# Patient Record
Sex: Female | Born: 1970 | Race: White | Hispanic: No | Marital: Married | Smoking: Former smoker
Health system: Southern US, Community
[De-identification: ages and names within clinical notes are randomized; demographics above are authoritative.]

## PROBLEM LIST (undated history)

## (undated) DIAGNOSIS — C50919 Malignant neoplasm of unspecified site of unspecified female breast: Secondary | ICD-10-CM

## (undated) DIAGNOSIS — F32A Depression, unspecified: Secondary | ICD-10-CM

## (undated) DIAGNOSIS — J189 Pneumonia, unspecified organism: Secondary | ICD-10-CM

## (undated) DIAGNOSIS — K219 Gastro-esophageal reflux disease without esophagitis: Secondary | ICD-10-CM

## (undated) DIAGNOSIS — F419 Anxiety disorder, unspecified: Secondary | ICD-10-CM

## (undated) HISTORY — PX: OTHER SURGICAL HISTORY: SHX169

## (undated) HISTORY — PX: MASTECTOMY: SHX3

## (undated) HISTORY — DX: Malignant neoplasm of unspecified site of unspecified female breast: C50.919

---

## 2001-06-19 ENCOUNTER — Encounter: Payer: Self-pay | Admitting: Oral Surgery

## 2001-06-19 ENCOUNTER — Inpatient Hospital Stay (HOSPITAL_COMMUNITY): Admission: RE | Admit: 2001-06-19 | Discharge: 2001-06-20 | Payer: Self-pay | Admitting: Oral Surgery

## 2008-04-01 ENCOUNTER — Ambulatory Visit (HOSPITAL_BASED_OUTPATIENT_CLINIC_OR_DEPARTMENT_OTHER): Admission: RE | Admit: 2008-04-01 | Discharge: 2008-04-01 | Payer: Self-pay | Admitting: Orthopedic Surgery

## 2008-04-04 ENCOUNTER — Encounter (HOSPITAL_COMMUNITY): Admission: RE | Admit: 2008-04-04 | Discharge: 2008-05-04 | Payer: Self-pay | Admitting: Orthopedic Surgery

## 2008-05-07 ENCOUNTER — Encounter (HOSPITAL_COMMUNITY): Admission: RE | Admit: 2008-05-07 | Discharge: 2008-06-06 | Payer: Self-pay | Admitting: Orthopedic Surgery

## 2008-06-09 ENCOUNTER — Encounter (HOSPITAL_COMMUNITY): Admission: RE | Admit: 2008-06-09 | Discharge: 2008-07-02 | Payer: Self-pay | Admitting: Orthopedic Surgery

## 2010-11-16 NOTE — Op Note (Signed)
NAME:  Amy Douglas, Amy Douglas NO.:  [id number]   MEDICAL RECORD NO.:  [id number]          PATIENT TYPE:  AMB   LOCATION:  DSC                          FACILITY:  [facility]   PHYSICIAN:  Eulas Post, MD    DATE OF BIRTH:  [birth year]   DATE OF PROCEDURE:  04/01/2008  DATE OF DISCHARGE:                               OPERATIVE REPORT   PREOPERATIVE DIAGNOSES:  Right shoulder impingement, AC joint arthritis,  and adhesive capsulitis.   POSTOPERATIVE DIAGNOSIS:  Right shoulder impingement, AC joint  arthritis, and adhesive capsulitis.   OPERATIVE PROCEDURE:  Right shoulder arthroscopy with acromioplasty,  distal clavicle excision, and manipulation under anesthesia.   PREOPERATIVE INDICATIONS:  Ms. Zuniga is a 40 year old woman who  complained about 2 years of right shoulder pain.  She had pain, mostly  with overhead motion, but it was fairly excruciating and limiting her  daily activities.  She elected to undergo the above-named procedures.  The risks, benefits and alternatives were discussed with her  preoperatively including, but not limited to risks of infection,  bleeding, nerve injury, rotator cuff tear, recurrent shoulder pain,  recurrent stiffness, cardiopulmonary complications, among others and she  is willing to proceed.   OPERATIVE FINDINGS:  The biceps tendon and labrum were completely  normal.  The glenohumeral surface was normal.  The rotator cuff was  completely normal from the articular side.  There was excellent  insertion of the supraspinatus fibers noted at the region of the biceps  pulley.  The infraspinatus also had a normal insertion as did  subscapularis.   The subacromial space had a subacromial spur and AC joint arthritis  noted.  The bursal side of the rotator cuff was slightly thinned, but  there was no full-thickness tear and even with aggressive probing, I  could not find the tear.   Exam under anesthesia prior to manipulation had forward  flexion to 160  degrees.  After manipulation, she went up to 180 degrees.  I did feel a  palpable and audible release of adhesions with the manipulation,  although it was not as dramatic as many that I performed, it certainly  did have an element of frozen shoulder.   OPERATIVE PROCEDURE:  The patient was brought to the operating room,  placed in supine position.  General anesthesia was administered.  A 1 g  intravenous Ancef was given.  She was turned in the lateral decubitus  position and the right upper extremity prepped and draped in usual  sterile fashion with 10 pounds of traction.  Diagnostic arthroscopy was  carried out with the above-named findings.  Subacromial space was  visualized after intra-articular arthroscopy and the subacromial space  was debrided using a shaver.  The CA ligament was released using the  ArthroCare wand and acromioplasty was performed with a bur.  Distal  clavicle excision was then also carried out.  Confirmation of multiple views was performed and the arthroscopic  instruments were then removed.  Portals were closed with Monocryl and  Steri-Strips and sterile gauze was applied.  The patient was awakened  and returned  to the PACU in stable and satisfactory condition.  There  were no complications.  The patient tolerated the procedure well.      Eulas Post, MD  Electronically Signed     JPL/MEDQ  D:  04/01/2008  T:  04/01/2008  Job:  [id number]

## 2011-04-04 LAB — POCT HEMOGLOBIN-HEMACUE: Hemoglobin: 13.8

## 2015-03-31 DIAGNOSIS — C50911 Malignant neoplasm of unspecified site of right female breast: Secondary | ICD-10-CM | POA: Insufficient documentation

## 2015-03-31 DIAGNOSIS — G894 Chronic pain syndrome: Secondary | ICD-10-CM | POA: Insufficient documentation

## 2015-03-31 DIAGNOSIS — Z9011 Acquired absence of right breast and nipple: Secondary | ICD-10-CM | POA: Insufficient documentation

## 2015-04-09 DIAGNOSIS — Z95828 Presence of other vascular implants and grafts: Secondary | ICD-10-CM | POA: Insufficient documentation

## 2016-09-19 ENCOUNTER — Ambulatory Visit (HOSPITAL_COMMUNITY): Payer: Self-pay

## 2016-09-21 ENCOUNTER — Encounter (HOSPITAL_COMMUNITY): Payer: BLUE CROSS/BLUE SHIELD | Attending: Hematology | Admitting: Hematology

## 2016-09-21 ENCOUNTER — Encounter (HOSPITAL_COMMUNITY): Payer: Self-pay | Admitting: Hematology

## 2016-09-21 VITALS — BP 138/76 | HR 100 | Temp 98.4°F | Resp 16 | Ht 68.0 in | Wt 212.5 lb

## 2016-09-21 DIAGNOSIS — Z9011 Acquired absence of right breast and nipple: Secondary | ICD-10-CM | POA: Diagnosis not present

## 2016-09-21 DIAGNOSIS — Z171 Estrogen receptor negative status [ER-]: Secondary | ICD-10-CM | POA: Diagnosis not present

## 2016-09-21 DIAGNOSIS — C50811 Malignant neoplasm of overlapping sites of right female breast: Secondary | ICD-10-CM

## 2016-09-21 DIAGNOSIS — Z9221 Personal history of antineoplastic chemotherapy: Secondary | ICD-10-CM | POA: Diagnosis not present

## 2016-09-21 DIAGNOSIS — Z853 Personal history of malignant neoplasm of breast: Secondary | ICD-10-CM | POA: Diagnosis not present

## 2016-09-21 NOTE — Progress Notes (Signed)
[facility]    HEMATOLOGY/ONCOLOGY CONSULTATION NOTE  Date of Service: 09/21/2016  Patient Care Team: Celedonio Savage, MD as PCP - General (Family Medicine)  CHIEF COMPLAINTS/PURPOSE OF CONSULTATION:  Transfer of care for continued management of triple negative breast cancer  HISTORY OF PRESENTING ILLNESS:   Amy Douglas is a 46 y.o. female who has been referred to [place] by Dr .Celedonio Savage, MD for evaluation and management of continued management of triple negative breast cancer.  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 Noted to have a high-grade triple negative tumor with negative lymph node biopsy.  Patient had a right breast mastectomy with sentinel lymph node biopsy on 03/10/2015 which showed a 4.3 cm high-grade tumor with negative margins negative sentinel lymph nodes grade 3. No DCIS noted no lymphovascular invasion. ER 1% positive, PR 0%, HER-2 negative. pT2pN0Mx (Stage IIA)  Patient was started on dose dense AC chemotherapy from 04/16/2015-05/27/2015. She apparently had significant issues with her [facility] chemotherapy and refused to continue adjuvant treatment with weekly Taxol. She also requests of her Port-A-Cath to be removed.  Patient reports that she has had genetic counseling and testing that apparently has not noted any obvious genetic mutations.  She has been treated by Dr. Audree Camel thus far and was last seen by him on 10/08/2015.  She has had a follow-up and a mammogram in August 2017 with her primary care physician which showed no evidence of recurrent breast cancer in the left breast.  She is here for further follow-up with [place]. She notes no acute new concerns. Has not noted any new regional lumps or bumps, no new skin changes over the breast, no palpable lumps in the left breast.  Energy levels have been good and she is working full-time. She notes hot flashes but  feels that these are mild and controlled. Denies any other focal symptoms Notes that she recently has had labs with her primary care physician.  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 Patient also notes continued his disease in the cervical spine.  #5 dyslipidemia  #6 vitamin D deficiency  #7 depression -on sertraline   SURGICAL HISTORY: #1 right breast mastectomy August 2016 with sentinel lymph node biopsy #2 cleft lip/cleft palate surgery .  SOCIAL HISTORY: Social History   Social History  . Marital status: Married    Spouse name: N/A  . Number of children: N/A  . Years of education: N/A   Occupational History  . Not on file.   Social History Main Topics  . Smoking status: Not on file  . Smokeless tobacco: Not on file  . Alcohol use Not on file  . Drug use: Unknown  . Sexual activity: Not on file   Other Topics Concern  . Not on file   Social History Narrative  . No narrative on file  Married and lives with her husband and son who is in his early 78s. Works as a Psychologist, educational at [employer] No alcohol use Denies any drug use  FAMILY HISTORY: Patient notes that she does not know much about her dad's side of the family  Mother had 51 siblings . One of them had stomach cancer in his 50s .does not know much about the others   ALLERGIES:  is allergic to hydroxyzine pamoate; amoxicillin-pot  clavulanate; latex; and other.  MEDICATIONS:  Current Outpatient Prescriptions  Medication Sig Dispense Refill  . omeprazole (PRILOSEC) 20 MG capsule Take 20 mg by mouth daily.    . propranolol (INDERAL) 10 MG tablet Take 10 mg by mouth daily.     . sertraline (ZOLOFT) 100 MG tablet Take 100 mg by mouth daily.     No current facility-administered medications for this visit.     REVIEW OF SYSTEMS:    10 Point review of Systems was  done is negative except as noted above.  PHYSICAL EXAMINATION: ECOG PERFORMANCE STATUS: 1 - Symptomatic but completely ambulatory  . Vitals:   09/21/16 1338  BP: 138/76  Pulse: 100  Resp: 16  Temp: 98.4 F (36.9 C)   Filed Weights   09/21/16 1338  Weight: 212 lb 8 oz (96.4 kg)   .Body mass index is 32.31 kg/m.  GENERAL:alert, in no acute distress and comfortable SKIN: no acute rashes, no significant lesions EYES: conjunctiva are pink and non-injected, sclera anicteric OROPHARYNX: MMM, no exudates, no oropharyngeal erythema or ulceration NECK: supple, no JVD LYMPH:  no palpable lymphadenopathy in the cervical, axillary or inguinal regions LUNGS: clear to auscultation b/l with normal respiratory effort BREAST: s/p rt mastectomy - surgical incision area healed with no concerning new skin changes, no left breast lumps palpable. No palpable regional lymphadenopathy noted HEART: regular rate & rhythm ABDOMEN:  normoactive bowel sounds , non tender, not distended. Extremity: no pedal edema PSYCH: alert & oriented x 3 with fluent speech NEURO: no focal motor/sensory deficits  LABORATORY DATA:  I have reviewed the data as listed  . CBC 04/01/2008  Hemoglobin 13.8 POINT OF CARE RESULT    .No flowsheet data found.   RADIOGRAPHIC STUDIES: I have personally reviewed the radiological images as listed and agreed with the findings in the report. No results found.  ASSESSMENT & PLAN:   46 yo caucasian female with   1) Stage II A triple negative breast cancer.  02/26/2015 Initial Diagnosis  Palbable mass -right breast - bx -invasive carcinoma , high grade,benign lymph node bx   03/10/2015 Surgery  Right breast mastectomy, sentinel lymph node biopsy for invasive ductal carcinoma, high-grade, of 4.3 cm in maximal dimension, negative margins, zero lymph nodes positive. Grade 3. No DCIS. No lymphovascular invasion. ER 1%, PR 0%, HER-2 1+.pT2, N0, MX  04/16/2015 - 05/27/2015  Chemotherapy  AC DD X4  PLan -patient had her last mammogram in August 2017 that showed no evidence of concerning lesions in her left breast. -Today her clinical examination does not suggest any evidence of breast cancer recurrence at this time. -She reports that she is recently had labs with her primary care physician which were within normal limits and chooses not to have repeat labs at this time. We shall try to obtain these labs from her primary care physician. -Her mammogram results from August 2017 were received from the primary care physician and reviewed and she'll be scanned into her chart for reference. -Her outside genetic testing results have been requested for review. As per patient absolutely no genetic mutations were noted though given a triple negative cancer and young age this was certainly concerning. -No other acute new focal symptoms at this time. -Maintain good physical activity level -Continue calcium and vitamin D -Is on Zoloft as per primary care physician for depression which also helped somewhat for hot flashes. -For hot flashes she was recommended to keep well-hydrated, maintain good physical activity, decrease caffeine intake. She  will also try vitamin E 400 units daily over-the-counter. -Continue follow-up with primary care physician for management of other medical comorbidities.  Return to care with M.D./PA in 3 months with repeat labs Next mammogram will be due in August 2018    All of the patients questions were answered with apparent satisfaction. The patient knows to call the clinic with any problems, questions or concerns.  I spent 45 minutes counseling the patient face to face. The total time spent in the appointment was 60 minutes and more than 50% was on counseling and direct patient cares.    Sullivan Lone MD [place] AAHIVMS [facility] Hematology/Oncology Physician [facility]  (Office):       [phone] (Work cell):  [phone] (Fax):            [phone]  09/21/2016 1:22 PM

## 2016-09-21 NOTE — Patient Instructions (Addendum)
[place] at [facility] Discharge Instructions  RECOMMENDATIONS MADE BY THE CONSULTANT AND ANY TEST RESULTS WILL BE SENT TO YOUR REFERRING PHYSICIAN.  You were seen today by Dr. Irene Limbo Follow up 3 months with labs See Amy up front for appointments   Thank you for choosing [place] at [facility] to provide your oncology and hematology care.  To afford each patient quality time with our provider, please arrive at least 15 minutes before your scheduled appointment time.    If you have a lab appointment with the [facility] please come in thru the  Main Entrance and check in at the main information desk  You need to re-schedule your appointment should you arrive 10 or more minutes late.  We strive to give you quality time with our providers, and arriving late affects you and other patients whose appointments are after yours.  Also, if you no show three or more times for appointments you may be dismissed from the clinic at the providers discretion.     Again, thank you for choosing [facility].  Our hope is that these requests will decrease the amount of time that you wait before being seen by our physicians.       _____________________________________________________________  Should you have questions after your visit to [facility], please contact our office at [phone] between the hours of 8:30 a.m. and 4:30 p.m.  Voicemails left after 4:30 p.m. will not be returned until the following business day.  For prescription refill requests, have your pharmacy contact our office.       Resources For Cancer Patients and their Caregivers ? American Cancer Society: Can assist with transportation, wigs, general needs, runs Look Good Feel Better.        587 376 0310 ? Cancer Care: Provides financial assistance, online support groups, medication/co-pay assistance.  1-800-813-HOPE (404)271-2406) ? [facility] Assists [place] cancer patients and their families through emotional , educational and financial support.  479-481-6912 ? [place] DSS Where to apply for food stamps, Medicaid and utility assistance. 204 351 0813 ? [facility]: Transportation to medical appointments. 979-251-0646 ? Social Security Administration: May apply for disability if have a Stage IV cancer. (870) 480-5109 438 104 5808 ? [employer], Disability and Transit Services: Assists with nutrition, care and transit needs. [place] Support Programs: @10RELATIVEDAYS @ > Cancer Support Group  2nd Tuesday of the month 1pm-2pm, Journey Room  > Creative Journey  3rd Tuesday of the month 1130am-1pm, Journey Room  > Look Good Feel Better  1st Wednesday of the month 10am-12 noon, Journey Room (Call [place] to register [phone])

## 2016-12-20 ENCOUNTER — Other Ambulatory Visit (HOSPITAL_COMMUNITY): Payer: Self-pay | Admitting: *Deleted

## 2016-12-20 DIAGNOSIS — C50011 Malignant neoplasm of nipple and areola, right female breast: Secondary | ICD-10-CM

## 2016-12-22 ENCOUNTER — Ambulatory Visit (HOSPITAL_COMMUNITY): Payer: BLUE CROSS/BLUE SHIELD

## 2016-12-22 ENCOUNTER — Other Ambulatory Visit (HOSPITAL_COMMUNITY): Payer: BLUE CROSS/BLUE SHIELD

## 2017-03-22 NOTE — Progress Notes (Signed)
Patient ID: Amy Douglas, female    DOB: 1970/12/09, 46 y.o.   MRN: [id number]  Chief Complaint  Patient presents with  . Sore Throat    Allergies Hydroxyzine pamoate; Amoxicillin-pot clavulanate; Latex; and Other  Subjective:   Amy Douglas is a 46 y.o. female who presents to [facility] today.  HPI Here to establish care. Reports that had a sore throat since Sunday but it is better now. Believes it is due to allergies. Has history of allergies and started back on her zyrec when this started and it is better. Had post nasal drip down the back of throat and now it is better. Reports that she gets sinus infections frequently but does not have sinu pressure or ear pain. Has had a long history of headaches that were thought to be due to migraines around menses. Headaches occur sporadically. Has had them for years on and off. May not have for a month and then have it for several days. May be more frequent with menses. Menses are irregular due to chemotherapy since breast cancer. Has not been to see oncologist when was scheduled b/c she reports that the cost was so much for the visit.  Was put on the inderal years ago for headaches and told at that time that bp was borderline and it would help with both. Was decreased on the inderal during chemo b/c BP would get to low. Then restarted it to one pill a day. Does not believe that it helps or hurts headaches. Takes it in the morning. Reports that feels tired a good bit. Got a promotion and works at [employer].     Past Medical History:  Diagnosis Date  . Breast cancer [facility])     Past Surgical History:  Procedure Laterality Date  . MASTECTOMY    . maxillofacial Bilateral     Family History  Problem Relation Age of Onset  . Hypertension Mother   . Diabetes Father   . Dementia Father   . Heart disease Father      Social History   Social History  . Marital status: Married    Spouse name: N/A  . Number of children: N/A    . Years of education: N/A   Social History Main Topics  . Smoking status: Never Smoker  . Smokeless tobacco: Never Used  . Alcohol use No  . Drug use: No  . Sexual activity: Yes   Other Topics Concern  . None   Social History Narrative  . None    Review of Systems  Constitutional: Negative for activity change, appetite change, chills, diaphoresis and fatigue.  HENT: Positive for postnasal drip. Negative for congestion, ear pain, facial swelling, rhinorrhea, sinus pain, sinus pressure, sneezing and sore throat.   Eyes: Negative for photophobia and visual disturbance.  Respiratory: Negative for cough, chest tightness, wheezing and stridor.   Cardiovascular: Negative for chest pain.  Gastrointestinal: Negative for abdominal pain.  Endocrine: Negative for polydipsia and polyphagia.  Genitourinary: Negative for dyspareunia, frequency and urgency.  Musculoskeletal: Negative for myalgias.  Skin: Negative for rash.  Allergic/Immunologic: Positive for environmental allergies.  Neurological: Negative for dizziness, tremors, seizures, syncope, speech difficulty, weakness and light-headedness.  Hematological: Negative for adenopathy. Does not bruise/bleed easily.  Psychiatric/Behavioral: Negative for agitation.     Objective:   BP 138/86 (BP Location: Left Arm, Patient Position: Sitting, Cuff Size: Normal)   Pulse 64   Temp 98.5 F (36.9 C) (Other (Comment))   Resp 16  Ht 5\' 8"  (1.727 m)   Wt 213 lb 8 oz (96.8 kg)   LMP 03/09/2017   SpO2 98%   BMI 32.46 kg/m   Physical Exam  Constitutional: She is oriented to person, place, and time. She appears well-developed and well-nourished.  HENT:  Right Ear: External ear normal.  Left Ear: External ear normal.  Nose: Nose normal.  Mouth/Throat: Oropharynx is clear and moist. No oropharyngeal exudate.  Eyes: Pupils are equal, round, and reactive to light. Conjunctivae and EOM are normal.  Neck: Normal range of motion. Neck supple.   Cardiovascular: Normal rate, regular rhythm and normal heart sounds.  Exam reveals no friction rub.   No murmur heard. Pulmonary/Chest: Effort normal and breath sounds normal. No respiratory distress.  Musculoskeletal: Normal range of motion. She exhibits no edema.  Lymphadenopathy:    She has no cervical adenopathy.  Neurological: She is alert and oriented to person, place, and time. No cranial nerve deficit.  Skin: Skin is warm and dry.  Psychiatric: She has a normal mood and affect. Her behavior is normal.  Vitals reviewed.    Assessment and Plan   1. Anxiety Stable. Continue the zoloft. Patient counseled in detail regarding the risks of medication. Told to call or return to clinic if develop any worrisome signs or symptoms. Patient voiced understanding.  Suicide risks evaluated and documented in note if present or in the area below.  Patient specifically denies suicide ideation. Patient has access/information to healthcare contacts if situation or mood changes where patient is a risk to self or others or mood becomes unstable.   During the encounter, the patient had good eye contact and firm handshake regarding safety contract and agreement to seek help if mood worsens and not to harm self.   Patient understands the treatment plan and is in agreement. Agrees to keep follow up and call prior or return to clinic if needed.   - sertraline (ZOLOFT) 100 MG tablet; Take 1 tablet (100 mg total) by mouth daily.  Dispense: 90 tablet; Refill: 1  2. Need for immunization against influenza  - Flu Vaccine QUAD 6+ mos PF IM (Fluarix Quad PF)   3. Seasonal allergic rhinitis due to pollen Continue zytrec. If seems to cause to be tired, change to claritin.   4. History of migraine headaches and Menstrual migraine without status migrainosus, not intractable Migraine diary. D/c the inderal and if headaches recur upon stopping, call clinic. Patient will check BP at home and if elevated will  call clinic and then we will start on HCTZ. If BP is running high and headache recur then we will do the long acting inderal at 20 mg.  Patient walks a lot at work and will work on diet for salt reduction. Check BP at follow up.   5. History of breast cancer: will place [facility]. Discussed with patient in detail the need for regular follow up and surveillance. j Call with BP.  Return in about 2 months (around 05/28/2017). [name], MD 03/28/2017

## 2017-03-28 ENCOUNTER — Encounter: Payer: Self-pay | Admitting: Family Medicine

## 2017-03-28 ENCOUNTER — Ambulatory Visit (INDEPENDENT_AMBULATORY_CARE_PROVIDER_SITE_OTHER): Payer: BLUE CROSS/BLUE SHIELD | Admitting: Family Medicine

## 2017-03-28 VITALS — BP 138/86 | HR 64 | Temp 98.5°F | Resp 16 | Ht 68.0 in | Wt 213.5 lb

## 2017-03-28 DIAGNOSIS — G43909 Migraine, unspecified, not intractable, without status migrainosus: Secondary | ICD-10-CM | POA: Insufficient documentation

## 2017-03-28 DIAGNOSIS — J301 Allergic rhinitis due to pollen: Secondary | ICD-10-CM

## 2017-03-28 DIAGNOSIS — C50011 Malignant neoplasm of nipple and areola, right female breast: Secondary | ICD-10-CM

## 2017-03-28 DIAGNOSIS — G43829 Menstrual migraine, not intractable, without status migrainosus: Secondary | ICD-10-CM

## 2017-03-28 DIAGNOSIS — F419 Anxiety disorder, unspecified: Secondary | ICD-10-CM | POA: Insufficient documentation

## 2017-03-28 DIAGNOSIS — Z23 Encounter for immunization: Secondary | ICD-10-CM

## 2017-03-28 DIAGNOSIS — R030 Elevated blood-pressure reading, without diagnosis of hypertension: Secondary | ICD-10-CM | POA: Diagnosis not present

## 2017-03-28 DIAGNOSIS — Z8669 Personal history of other diseases of the nervous system and sense organs: Secondary | ICD-10-CM | POA: Diagnosis not present

## 2017-03-28 DIAGNOSIS — Z9011 Acquired absence of right breast and nipple: Secondary | ICD-10-CM

## 2017-03-28 MED ORDER — SERTRALINE HCL 100 MG PO TABS: 100.0000 mg | ORAL_TABLET | Freq: Every day | ORAL | 1 refills | Status: AC

## 2017-04-10 ENCOUNTER — Telehealth: Payer: Self-pay | Admitting: Family Medicine

## 2017-04-10 NOTE — Telephone Encounter (Signed)
Patient left a msg Requesting Bp medication because she states her her blood pressure is borderline high. Cb# [phone]

## 2017-04-11 NOTE — Telephone Encounter (Signed)
Please call patient and see how her BP is running. I told her at her visit that it was borderline. She was supposed to record and bring readings to her follow up. She needs to follow up for [place] to start medications so that we can discuss risks and benefits. Gwen Her. Mannie Stabile, MD

## 2017-04-11 NOTE — Telephone Encounter (Signed)
Called patient regarding message below. No answer, left generic message for patient to return call.   

## 2017-05-04 ENCOUNTER — Encounter (HOSPITAL_COMMUNITY): Payer: BLUE CROSS/BLUE SHIELD | Attending: Oncology | Admitting: Oncology

## 2017-05-04 ENCOUNTER — Encounter (HOSPITAL_COMMUNITY): Payer: Self-pay | Admitting: Oncology

## 2017-05-04 VITALS — BP 144/78 | HR 82 | Temp 98.0°F | Resp 16 | Ht 68.0 in | Wt 213.0 lb

## 2017-05-04 DIAGNOSIS — C50911 Malignant neoplasm of unspecified site of right female breast: Secondary | ICD-10-CM

## 2017-05-04 DIAGNOSIS — Z853 Personal history of malignant neoplasm of breast: Secondary | ICD-10-CM

## 2017-05-04 NOTE — Progress Notes (Signed)
Marland Kitchen    HEMATOLOGY/ONCOLOGY CONSULTATION NOTE  Date of Service: 05/04/2017  Patient Care Team: Celedonio Savage, MD as PCP - General (Family Medicine)  CHIEF COMPLAINTS/PURPOSE OF CONSULTATION:  Transfer of care for continued management of triple negative breast cancer  HISTORY OF PRESENTING ILLNESS:   Amy Douglas is a 46 y.o. female who has been referred to [place] by Dr .Celedonio Savage, MD for evaluation and management of continued management of triple negative breast cancer.  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 Noted to have a high-grade triple negative tumor with negative lymph node biopsy.  Patient had a right breast mastectomy with sentinel lymph node biopsy on 03/10/2015 which showed a 4.3 cm high-grade tumor with negative margins negative sentinel lymph nodes grade 3. No DCIS noted no lymphovascular invasion. ER 1% positive, PR 0%, HER-2 negative. pT2pN0Mx (Stage IIA)  Patient was started on dose dense AC chemotherapy from 04/16/2015-05/27/2015. She apparently had significant issues with her [facility] chemotherapy and refused to continue adjuvant treatment with weekly Taxol. She also requests of her Port-A-Cath to be removed.  Patient reports that she has had genetic counseling and testing that apparently has not noted any obvious genetic mutations.  She has been treated by Dr. Audree Camel thus far and was last seen by him on 10/08/2015.  She has had a follow-up and a mammogram in August 2017 with her primary care physician which showed no evidence of recurrent breast cancer in the left breast.  INTERVAL HISTORY: 05/04/17: Patient presents today for continued follow-up.  She has been lost to follow-up due to difficulties with paying her bills.  She states that overall she has been doing well.  She has not felt any masses on her left breast or right chest wall.  She had her  last mammogram at the [facility] center in August 2018 and it was negative for malignancy.  She denies any new health issues.  She denies any chest pain, shortness breath, abdominal pain, nausea, vomiting, diarrhea, headaches, blurry vision, focal weakness.  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 Patient also notes continued his disease in the cervical spine.  #5 dyslipidemia  #6 vitamin D deficiency  #7 depression -on sertraline   SURGICAL HISTORY: #1 right breast mastectomy August 2016 with sentinel lymph node biopsy #2 cleft lip/cleft palate surgery .  SOCIAL HISTORY: Social History   Social History  . Marital status: Married    Spouse name: N/A  . Number of children: N/A  . Years of education: N/A   Occupational History  . Not on file.   Social History Main Topics  . Smoking status: Never Smoker  . Smokeless tobacco: Never Used  . Alcohol use No  . Drug use: No  . Sexual activity: Yes   Other Topics Concern  . Not on file   Social History Narrative  . No narrative on file  Married and lives with her husband and son who is in his early 48s. Works as a Psychologist, educational at [employer] No alcohol use Denies any drug use  FAMILY HISTORY: Patient notes that she does not know much about her dad's side of the family  Mother had 7 siblings . One of them had stomach cancer in his 50s .does not know much about the others  ALLERGIES:  is allergic to hydroxyzine pamoate; amoxicillin-pot clavulanate; latex; and other.  MEDICATIONS:  Current Outpatient Prescriptions  Medication Sig Dispense Refill  . omeprazole (PRILOSEC) 20 MG capsule Take 20 mg by mouth daily.    . sertraline (ZOLOFT) 100 MG tablet Take 1 tablet (100 mg total) by mouth daily. 90 tablet 1   No current facility-administered medications for this visit.     REVIEW OF  SYSTEMS:    10 Point review of Systems was done is negative except as noted above.  PHYSICAL EXAMINATION: ECOG PERFORMANCE STATUS: 1 - Symptomatic but completely ambulatory  . Vitals:   05/04/17 0905  BP: (!) 144/78  Pulse: 82  Resp: 16  Temp: 98 F (36.7 C)  SpO2: 100%   Filed Weights   05/04/17 0905  Weight: 213 lb (96.6 kg)   .Body mass index is 32.39 kg/m.  Constitutional: Well-developed, well-nourished, and in no distress.   HENT:  Head: Normocephalic and atraumatic.  Mouth/Throat: No oropharyngeal exudate. Mucosa moist. Eyes: Pupils are equal, round, and reactive to light. Conjunctivae are normal. No scleral icterus.  Neck: Normal range of motion. Neck supple. No JVD present.  Cardiovascular: Normal rate, regular rhythm and normal heart sounds.  Exam reveals no gallop and no friction rub.   No murmur heard. Pulmonary/Chest: Effort normal and breath sounds normal. No respiratory distress. No wheezes.No rales.  Abdominal: Soft. Bowel sounds are normal. No distension. There is no tenderness. There is no guarding.  Musculoskeletal: No edema or tenderness.  Lymphadenopathy:    No cervical or supraclavicular adenopathy.  Neurological: Alert and oriented to person, place, and time. No cranial nerve deficit.  Skin: Skin is warm and dry. No rash noted. No erythema. No pallor.  Psychiatric: Affect and judgment normal.   Breast: Left breast without any palpable masses, nipple discharge, skin changes. Right chest wall with well healed mastectomy scar, no subcutaneous nodules. No axillary lymphadenopathy bilaterally.  LABORATORY DATA:  I have reviewed the data as listed  . CBC 04/01/2008  Hemoglobin 13.8 POINT OF CARE RESULT    .No flowsheet data found.   RADIOGRAPHIC STUDIES: I have personally reviewed the radiological images as listed and agreed with the findings in the report. No results found.  ASSESSMENT & PLAN:   46 y.o. caucasian female with   1) Stage II A  triple negative breast cancer.  02/26/2015 Initial Diagnosis  Palbable mass -right breast - bx -invasive carcinoma , high grade,benign lymph node bx   03/10/2015 Surgery  Right breast mastectomy, sentinel lymph node biopsy for invasive ductal carcinoma, high-grade, of 4.3 cm in maximal dimension, negative margins, zero lymph nodes positive. Grade 3. No DCIS. No lymphovascular invasion. ER 1%, PR 0%, HER-2 1+.pT2, N0, MX  04/16/2015 - 05/27/2015 Chemotherapy  AC DD X4  PLAN: -Clinically NED on breast exam today. -Next left breast mammogram in August 2019. -Discussed with the patient the importance of follow up for her breast cancer. -RTC in 6 months for follow up and breast exam.  All of the patients questions were answered with apparent satisfaction. The patient knows to call the clinic with any problems, questions or concerns.  Twana First, MD 05/04/2017 8:45 AM

## 2017-07-07 ENCOUNTER — Ambulatory Visit (INDEPENDENT_AMBULATORY_CARE_PROVIDER_SITE_OTHER): Payer: BLUE CROSS/BLUE SHIELD | Admitting: Family Medicine

## 2017-07-07 ENCOUNTER — Encounter: Payer: Self-pay | Admitting: Family Medicine

## 2017-07-07 ENCOUNTER — Other Ambulatory Visit: Payer: Self-pay

## 2017-07-07 VITALS — BP 140/98 | HR 105 | Temp 97.5°F | Resp 16 | Ht 67.0 in | Wt 216.0 lb

## 2017-07-07 DIAGNOSIS — G43009 Migraine without aura, not intractable, without status migrainosus: Secondary | ICD-10-CM | POA: Diagnosis not present

## 2017-07-07 DIAGNOSIS — Z79899 Other long term (current) drug therapy: Secondary | ICD-10-CM

## 2017-07-07 DIAGNOSIS — I10 Essential (primary) hypertension: Secondary | ICD-10-CM | POA: Diagnosis not present

## 2017-07-07 DIAGNOSIS — R5383 Other fatigue: Secondary | ICD-10-CM | POA: Diagnosis not present

## 2017-07-07 DIAGNOSIS — R0683 Snoring: Secondary | ICD-10-CM

## 2017-07-07 MED ORDER — PROPRANOLOL HCL ER 60 MG PO CP24
60.0000 mg | ORAL_CAPSULE | Freq: Every day | ORAL | 0 refills | Status: DC
Start: 2017-07-07 — End: 2017-09-24

## 2017-07-07 MED ORDER — HYDROCHLOROTHIAZIDE 25 MG PO TABS: 25.0000 mg | ORAL_TABLET | Freq: Every day | ORAL | 3 refills | Status: DC

## 2017-07-07 NOTE — Progress Notes (Signed)
Patient ID: Treasa Bradshaw, female    DOB: [birth year], 46 y.o.   MRN: [id number]  Chief Complaint  Patient presents with  . Hypertension    Allergies Hydroxyzine pamoate; Amoxicillin-pot clavulanate; Latex; and Other  Subjective:   Ardell Aaronson is a 47 y.o. female who presents to [facility] today.  HPI Mrs. Perman presents today for a follow up of her blood pressure.  She was seen back in September for her elevated blood pressure but is just getting around to following back up.  She reports that yesterday when went to doctor with husband her bp was checked and it was elevated at 140/100.  She reports she is checked her blood pressure on multiple occasions and it has been elevated.  She has been taking propranolol 10 mg twice a day for her migraines.  She reports that she has not always been consistent with this medication.  She reports that she does have a headache today consistent with her usual migraine headaches.  She reports that she now only gets her migraines when she is about to start her menses.  She has had a long history of migraines and has been on beta-blockers at low doses for many years for her headaches.  She reports that she is concerned about her blood pressure and would like to get on some medication for it.  She denies any chest pain or shortness of breath.  No swelling in her extremities.  She does not do formal exercise but walks a lot with her job.  She reports that her weight has been stable.  She reports that her heart rate is a little bit elevated today but most the time when she goes to the doctor it is always elevated.  She reports is been that way since she was a child.  She is still taking her Zoloft for her mood.  She reports her mood is good and her energy level is pretty good.  She reports she does get fatigued at times but that she does not feel sad or depressed.  She reports that after her last visit at our office she did follow-up at [facility] as directed in regards to her history of breast cancer.  She is scheduled to follow-up there again in 6 months.  She has the forms to get her mammogram done.  Also reports that she has a concern because her family tells her that she snores very loud and often seems like she quits breathing at night.  She reports that her husband is told her this for many years.  She has had multiple surgeries in the past for cleft lip and cleft palate.  She denies daytime fatigue for the most part.  She denies problems with her breathing.  She does report that from time to time she gets sinus infections related to her cleft palate.  Hypertension  This is a chronic problem. The current episode started more than 1 year ago. The problem has been waxing and waning since onset. The problem is uncontrolled. Associated symptoms include headaches. Pertinent negatives include no anxiety, blurred vision, chest pain, neck pain, orthopnea, palpitations, peripheral edema, PND or shortness of breath. There are no associated agents to hypertension. Risk factors for coronary artery disease include family history and obesity. Past treatments include beta blockers. The current treatment provides mild improvement. Compliance problems: Reports that she often forgets to take the propranolol as directed.  There is no history of angina, kidney disease,  CAD/MI, CVA, heart failure, left ventricular hypertrophy, PVD or retinopathy.  Migraine   This is a chronic problem. The current episode started more than 1 year ago. The problem occurs monthly. The problem has been unchanged. The pain is located in the left unilateral region. The pain does not radiate. The pain quality is similar to prior headaches. The quality of the pain is described as aching. The pain is at a severity of 5/10. The pain is mild. Pertinent negatives include no abdominal pain, abnormal behavior, anorexia, blurred vision, coughing, drainage, ear pain, eye pain, fever, hearing  loss, insomnia, loss of balance, muscle aches, nausea, neck pain, rhinorrhea, scalp tenderness, seizures, sinus pressure, sore throat, swollen glands, tingling, vomiting or weakness. The symptoms are aggravated by menstrual cycle. She has tried beta blockers for the symptoms. The treatment provided significant relief. Her past medical history is significant for hypertension, migraine headaches and migraines in the family.   Current Outpatient Medications on File Prior to Visit  Medication Sig Dispense Refill  . omeprazole (PRILOSEC) 20 MG capsule Take 20 mg by mouth daily.    . sertraline (ZOLOFT) 100 MG tablet Take 1 tablet (100 mg total) by mouth daily. 90 tablet 1   No current facility-administered medications on file prior to visit.     Past Medical History:  Diagnosis Date  . Breast cancer [facility])     Past Surgical History:  Procedure Laterality Date  . MASTECTOMY    . maxillofacial Bilateral     Family History  Problem Relation Age of Onset  . Hypertension Mother   . Diabetes Father   . Dementia Father   . Heart disease Father      Social History   Socioeconomic History  . Marital status: Married    Spouse name: None  . Number of children: None  . Years of education: None  . Highest education level: None  Social Needs  . Financial resource strain: None  . Food insecurity - worry: None  . Food insecurity - inability: None  . Transportation needs - medical: None  . Transportation needs - non-medical: None  Occupational History  . None  Tobacco Use  . Smoking status: Never Smoker  . Smokeless tobacco: Never Used  Substance and Sexual Activity  . Alcohol use: No  . Drug use: No  . Sexual activity: Yes  Other Topics Concern  . None  Social History Narrative  . None    Review of Systems  Constitutional: Negative for activity change, appetite change, chills, fever and unexpected weight change.  HENT: Negative for congestion, ear discharge, ear pain, hearing  loss, mouth sores, rhinorrhea, sinus pressure, sore throat and trouble swallowing.   Eyes: Negative for blurred vision, pain and visual disturbance.  Respiratory: Negative for cough, chest tightness, shortness of breath, wheezing and stridor.   Cardiovascular: Negative for chest pain, palpitations, orthopnea, leg swelling and PND.  Gastrointestinal: Negative for abdominal pain, anorexia, nausea and vomiting.  Endocrine: Negative for polydipsia, polyphagia and polyuria.  Genitourinary: Negative for dysuria, frequency and urgency.  Musculoskeletal: Negative for neck pain.  Neurological: Positive for headaches. Negative for tingling, tremors, seizures, syncope, weakness and loss of balance.  Hematological: Negative for adenopathy. Does not bruise/bleed easily.  Psychiatric/Behavioral: Negative for behavioral problems, decreased concentration and dysphoric mood. The patient is not nervous/anxious and does not have insomnia.      Objective:   BP (!) 140/98 (BP Location: Left Arm, Patient Position: Sitting, Cuff Size: Normal)   Pulse Marland Kitchen)  105   Temp (!) 97.5 F (36.4 C) (Other (Comment))   Resp 16   Ht 5\' 7"  (1.702 m)   Wt 216 lb (98 kg)   SpO2 98%   BMI 33.83 kg/m   Physical Exam  Constitutional: She is oriented to person, place, and time. She appears well-developed and well-nourished. No distress.  HENT:  Head: Normocephalic and atraumatic.  Right Ear: External ear normal.  Left Ear: External ear normal.  Mouth/Throat: Oropharynx is clear and moist. No oropharyngeal exudate.  Scars on face consistent with surgeries.  Eyes: Conjunctivae and EOM are normal. Pupils are equal, round, and reactive to light.  Neck: Normal range of motion. Neck supple. No JVD present. No tracheal deviation present. No thyromegaly present.  Cardiovascular: Normal rate, regular rhythm and normal heart sounds.  Pulmonary/Chest: Effort normal and breath sounds normal. No respiratory distress.  Lymphadenopathy:     She has no cervical adenopathy.  Neurological: She is alert and oriented to person, place, and time. No cranial nerve deficit.  Skin: Skin is warm and dry.  Psychiatric: She has a normal mood and affect. Her behavior is normal. Judgment and thought content normal.  Nursing note and vitals reviewed.    Assessment and Plan  1. HTN, goal below 140/90 Today we discussed elevated blood pressure/hypertension and its implications on cardiovascular and renal functioning.  We also discussed that patient will need to follow-up in 3-4 weeks to recheck her potassium and kidney function and also make sure her blood pressure is better controlled.  She does have a family history of diabetes and cardiovascular disease.  She has not had routine blood work in many years.  We will check her other blood work to help risk stratify her and determine her ASCVD risk. - Hemoglobin A1c - Lipid panel -Diet, exercise, and weight loss recommended. - hydrochlorothiazide (HYDRODIURIL) 25 MG tablet; Take 1 tablet (25 mg total) by mouth daily.  Dispense: 90 tablet; Refill: 3 Patient counseled in detail regarding the risks of medication. Told to call or return to clinic if develop any worrisome signs or symptoms. Patient voiced understanding.  Lifestyle modifications discussed with patient including a diet emphasizing vegetables, fruits, and whole grains. Limiting intake of sodium to less than 2,400 mg per day.  Recommendations discussed include consuming low-fat dairy products, poultry, fish, legumes, non-tropical vegetable oils, and nuts; and limiting intake of sweets, sugar-sweetened beverages, and red meat. Discussed following a plan such as the Dietary Approaches to Stop Hypertension (DASH) diet. Patient to read up on this diet.   2. Other fatigue  - CBC with Differential/Platelet - TSH  3. High risk medication use  - Basic metabolic panel  4. Snoring Patient with significant history for snoring and questionable  sleep disordered breathing with risk for airway compromise secondary to multiple surgeries.  - Nocturnal polysomnography (NPSG); Future  5. Migraine without aura and without status migrainosus, not intractable - propranolol ER (INDERAL LA) 60 MG 24 hr capsule; Take 1 capsule (60 mg total) by mouth daily.  Dispense: 90 capsule; Refill: 0 -Discussed migraines with patient today.  At this time will DC propranolol 10 mg twice a day and start Inderal LA.  She will take this medication at night.  She was counseled concerning worrisome signs and symptoms of headache and if those occur to seek medical help.  She voiced understanding. Return in about 4 weeks (around 08/04/2017) for follow up. [name], MD 07/07/2017

## 2017-07-08 LAB — CBC WITH DIFFERENTIAL/PLATELET
BASOS ABS: 30 {cells}/uL (ref 0–200)
Basophils Relative: 0.7 %
EOS PCT: 2.6 %
Eosinophils Absolute: 112 cells/uL (ref 15–500)
HEMATOCRIT: 41.5 % (ref 35.0–45.0)
Hemoglobin: 14.2 g/dL (ref 11.7–15.5)
LYMPHS ABS: 1238 {cells}/uL (ref 850–3900)
MCH: 30 pg (ref 27.0–33.0)
MCHC: 34.2 g/dL (ref 32.0–36.0)
MCV: 87.6 fL (ref 80.0–100.0)
MPV: 10.9 fL (ref 7.5–12.5)
Monocytes Relative: 7 %
NEUTROS PCT: 60.9 %
Neutro Abs: 2619 cells/uL (ref 1500–7800)
Platelets: 211 10*3/uL (ref 140–400)
RBC: 4.74 10*6/uL (ref 3.80–5.10)
RDW: 13.5 % (ref 11.0–15.0)
TOTAL LYMPHOCYTE: 28.8 %
WBC mixed population: 301 cells/uL (ref 200–950)
WBC: 4.3 10*3/uL (ref 3.8–10.8)

## 2017-07-08 LAB — BASIC METABOLIC PANEL
BUN: 11 mg/dL (ref 7–25)
CALCIUM: 9.1 mg/dL (ref 8.6–10.2)
CO2: 26 mmol/L (ref 20–32)
Chloride: 104 mmol/L (ref 98–110)
Creat: 0.94 mg/dL (ref 0.50–1.10)
Glucose, Bld: 89 mg/dL (ref 65–99)
POTASSIUM: 4.1 mmol/L (ref 3.5–5.3)
Sodium: 136 mmol/L (ref 135–146)

## 2017-07-08 LAB — LIPID PANEL
CHOLESTEROL: 170 mg/dL (ref <200)
HDL: 27 mg/dL — ABNORMAL LOW (ref >50)
LDL CHOLESTEROL (CALC): 118 mg/dL — AB
Non-HDL Cholesterol (Calc): 143 mg/dL (calc) — ABNORMAL HIGH (ref <130)
Total CHOL/HDL Ratio: 6.3 (calc) — ABNORMAL HIGH (ref <5.0)
Triglycerides: 136 mg/dL (ref <150)

## 2017-07-08 LAB — HEMOGLOBIN A1C
HEMOGLOBIN A1C: 5.2 %{Hb} (ref <5.7)
MEAN PLASMA GLUCOSE: 103 (calc)
eAG (mmol/L): 5.7 (calc)

## 2017-07-08 LAB — TSH: TSH: 2.03 mIU/L

## 2017-07-11 ENCOUNTER — Encounter: Payer: Self-pay | Admitting: Family Medicine

## 2017-07-30 ENCOUNTER — Ambulatory Visit: Payer: BLUE CROSS/BLUE SHIELD | Attending: Family Medicine | Admitting: Neurology

## 2017-07-30 DIAGNOSIS — R0683 Snoring: Secondary | ICD-10-CM | POA: Insufficient documentation

## 2017-07-30 DIAGNOSIS — G4733 Obstructive sleep apnea (adult) (pediatric): Secondary | ICD-10-CM | POA: Insufficient documentation

## 2017-07-30 DIAGNOSIS — Z79899 Other long term (current) drug therapy: Secondary | ICD-10-CM | POA: Insufficient documentation

## 2017-08-05 NOTE — Procedures (Signed)
  [facility] Merlene Laughter, MD     [email or web address]             NOCTURNAL POLYSOMNOGRAPHY   LOCATION: [facility]  Patient Name: Amy Douglas, Amy Douglas Date: 07/30/2017 Gender: Female D.O.B: 11-19-1970 Age (years): 46 Referring Provider: [name] Height (inches): 68 Interpreting Physician: Phillips Odor MD, ABSM Weight (lbs): 216 RPSGT: Rosebud Poles BMI: 33 MRN: [id number] Neck Size: CLINICAL INFORMATION Sleep Study Type: NPSG     Indication for sleep study: Snoring     Epworth Sleepiness Score: 11     SLEEP STUDY TECHNIQUE As per the AASM Manual for the Scoring of Sleep and Associated Events v2.3 (April 2016) with a hypopnea requiring 4% desaturations.  The channels recorded and monitored were frontal, central and occipital EEG, electrooculogram (EOG), submentalis EMG (chin), nasal and oral airflow, thoracic and abdominal wall motion, anterior tibialis EMG, snore microphone, electrocardiogram, and pulse oximetry.  MEDICATIONS Medications self-administered by patient taken the night of the study : N/A  Current Outpatient Medications:  .  hydrochlorothiazide (HYDRODIURIL) 25 MG tablet, Take 1 tablet (25 mg total) by mouth daily., Disp: 90 tablet, Rfl: 3 .  omeprazole (PRILOSEC) 20 MG capsule, Take 20 mg by mouth daily., Disp: , Rfl:  .  propranolol ER (INDERAL LA) 60 MG 24 hr capsule, Take 1 capsule (60 mg total) by mouth daily., Disp: 90 capsule, Rfl: 0 .  sertraline (ZOLOFT) 100 MG tablet, Take 1 tablet (100 mg total) by mouth daily., Disp: 90 tablet, Rfl: 1     SLEEP ARCHITECTURE The study was initiated at 9:59:50 PM and ended at 5:01:47 AM.  Sleep onset time was 39.7 minutes and the sleep efficiency was 53.7%. The total sleep time was 226.7 minutes.  Stage REM latency was 334.0 minutes.  The patient spent 6.84% of the night in stage N1 sleep, 82.36% in stage N2 sleep, 7.94% in stage N3 and 2.87% in REM.  Alpha intrusion was  absent.  Supine sleep was 56.91%.  RESPIRATORY PARAMETERS The overall apnea/hypopnea index (AHI) was 24.1 per hour. There were 5 total apneas, including 5 obstructive, 0 central and 0 mixed apneas. There were 86 hypopneas and 0 RERAs.  The AHI during Stage REM sleep was 0.0 per hour.  AHI while supine was 40.0 per hour.  The mean oxygen saturation was 94.57%. The minimum SpO2 during sleep was 86.00%.  moderate snoring was noted during this study.  CARDIAC DATA The 2 lead EKG demonstrated sinus rhythm. The mean heart rate was 68.83 beats per minute. Other EKG findings include: None. LEG MOVEMENT DATA The total PLMS were 0 with a resulting PLMS index of 0.00. Associated arousal with leg movement index was 0.0.  IMPRESSIONS Moderate obstructive sleep apnea is documented. AutoPap 8-14 is suggested.   Delano Metz, MD Diplomate, American Board of Sleep Medicine. ELECTRONICALLY SIGNED ON:  08/05/2017, 4:00 PM [place] PH: [phone]   FX: [phone] [place]

## 2017-08-07 ENCOUNTER — Encounter: Payer: Self-pay | Admitting: Family Medicine

## 2017-08-07 ENCOUNTER — Other Ambulatory Visit: Payer: Self-pay

## 2017-08-07 ENCOUNTER — Ambulatory Visit (INDEPENDENT_AMBULATORY_CARE_PROVIDER_SITE_OTHER): Payer: BLUE CROSS/BLUE SHIELD | Admitting: Family Medicine

## 2017-08-07 VITALS — BP 130/80 | HR 98 | Temp 97.1°F | Resp 16 | Ht 67.0 in | Wt 218.2 lb

## 2017-08-07 DIAGNOSIS — Z23 Encounter for immunization: Secondary | ICD-10-CM

## 2017-08-07 DIAGNOSIS — F419 Anxiety disorder, unspecified: Secondary | ICD-10-CM | POA: Diagnosis not present

## 2017-08-07 DIAGNOSIS — Z114 Encounter for screening for human immunodeficiency virus [HIV]: Secondary | ICD-10-CM | POA: Diagnosis not present

## 2017-08-07 DIAGNOSIS — I10 Essential (primary) hypertension: Secondary | ICD-10-CM

## 2017-08-07 DIAGNOSIS — E786 Lipoprotein deficiency: Secondary | ICD-10-CM

## 2017-08-07 DIAGNOSIS — M62838 Other muscle spasm: Secondary | ICD-10-CM

## 2017-08-07 DIAGNOSIS — G4733 Obstructive sleep apnea (adult) (pediatric): Secondary | ICD-10-CM | POA: Diagnosis not present

## 2017-08-07 DIAGNOSIS — G43909 Migraine, unspecified, not intractable, without status migrainosus: Secondary | ICD-10-CM

## 2017-08-07 DIAGNOSIS — Z79899 Other long term (current) drug therapy: Secondary | ICD-10-CM | POA: Diagnosis not present

## 2017-08-07 MED ORDER — MELOXICAM 7.5 MG PO TABS: 7.5000 mg | ORAL_TABLET | Freq: Every day | ORAL | 0 refills | Status: DC

## 2017-08-07 MED ORDER — CYCLOBENZAPRINE HCL 5 MG PO TABS: 5.0000 mg | ORAL_TABLET | Freq: Three times a day (TID) | ORAL | 1 refills | Status: DC | PRN

## 2017-08-07 NOTE — Progress Notes (Signed)
Please call patient and advise that her sleep study did reveal moderate OSA. Autopap with pressure setting of 8-14. Pleas ask her if she has an autopap device at her home now? If she does not, we need to initiate services with home health. Gwen Her. Mannie Stabile, MD

## 2017-08-07 NOTE — Patient Instructions (Signed)
Fish Oil, Omega-3 Fatty Acids capsules (OTC) What is this medicine? FISH OIL, OMEGA-3 FATTY ACIDS (Fish Oil, oh MAY [place] - 3 fatty AS ids) are essential fats. It is promoted to help support a healthy heart. This dietary supplement is used to add to a healthy diet. The FDA has not approved this supplement for any medical use. This supplement may be used for other purposes; ask your health care provider or pharmacist if you have questions. This medicine may be used for other purposes; ask your health care provider or pharmacist if you have questions. COMMON BRAND NAME(S): [employer], Ocean [employer] Nutritionals Omega-3 1450, Ocean Blue Omega, [place] Professional Omega-3 2100, Omega-3, Omega-3 [place], Ovega-3, [place], TherOmega What should I tell my health care provider before I take this medicine? They need to know if you have any of these conditions -bleeding problems -lung or breathing disease, like asthma -an unusual or allergic reaction to fish oil, omega-3 fatty acids, fish, other medicines, foods, dyes, or preservatives -pregnant or trying to get pregnant -breast-feeding How should I use this medicine? Take this medicine by mouth with a glass of water. Follow the directions on the package or prescription label. Take with food. Take your medicine at regular intervals. Do not take your medicine more often than directed. Talk to your pediatrician regarding the use of this medicine in children. Special care may be needed. This medicine should not be used in children without a doctor's advice. Overdosage: If you think you have taken too much of this medicine contact a poison control center or emergency room at once. NOTE: This medicine is only for you. Do not share this medicine with others. What if I miss a dose? If you miss a dose, take it as soon as you can. If it is almost time for your next dose, take only that dose. Do not take double or extra doses. What may interact with this  medicine? -aspirin and aspirin-like medicines -herbal products like danshen, dong quai, garlic pills, ginger, ginkgo biloba, horse chestnut, willow bark, and others -medicines that treat or prevent blood clots like enoxaparin, heparin, warfarin This list may not describe all possible interactions. Give your health care provider a list of all the medicines, herbs, non-prescription drugs, or dietary supplements you use. Also tell them if you smoke, drink alcohol, or use illegal drugs. Some items may interact with your medicine. What should I watch for while using this medicine? Follow a good diet and exercise plan. Taking a dietary supplement does not replace a healthy lifestyle. Some foods that have omega-3 fatty acids naturally are fatty fish like albacore tuna, halibut, herring, mackerel, lake trout, salmon, and sardines. Too much of this supplement can be unsafe. Talk to your doctor or health care provider about how much of this supplement is right for you. If you are scheduled for any medical or dental procedure, tell your healthcare provider that you are taking this medicine. You may need to stop taking this medicine before the procedure. Herbal or dietary supplements are not regulated like medicines. Rigid quality control standards are not required for dietary supplements. The purity and strength of these products can vary. The safety and effect of this dietary supplement for a certain disease or illness is not well known. This product is not intended to diagnose, treat, cure or prevent any disease. The Food and Drug Administration suggests the following to help consumers protect themselves: -Always read product labels and follow directions. -Natural does not mean a  product is safe for humans to take. -Look for products that include USP after the ingredient name. This means that the manufacturer followed the standards of the [place] Pharmacopoeia. -Supplements made or sold by a nationally known food or  drug company are more likely to be made under tight controls. You can write to the company for more information about how the product was made. What side effects may I notice from receiving this medicine? Side effects that you should report to your doctor or health care professional as soon as possible: -allergic reactions like skin rash, itching or hives, swelling of the face, lips, or tongue -breathing problems -changes in your moods or emotions -unusual bleeding or bruising Side effects that usually do not require medical attention (report to your doctor or health care professional if they continue or are bothersome): -bad or fishy breath -belching -diarrhea -nausea -stomach gas, upset -weight gain This list may not describe all possible side effects. Call your doctor for medical advice about side effects. You may report side effects to FDA at 1-800-FDA-1088. Where should I keep my medicine? Keep out of the reach of children. Store at room temperature or as directed on the package label. Protect from moisture. Do not freeze. Throw away any unused medicine after the expiration date. NOTE: This sheet is a summary. It may not cover all possible information. If you have questions about this medicine, talk to your doctor, pharmacist, or health care provider.  2018 Elsevier/Gold Standard (2014-10-09 09:36:32) Lipoproteins Test Why am I having this test? This test measures lipoproteins in your blood. Lipoproteins transport cholesterol, triglycerides, and other fats to and from your tissues and liver. Lipoprotein blood levels are used to predict your risk for coronary artery disease (CAD). Low-density lipoproteins (LDL) carry cholesterol from the liver and deposit it in the cells of your body. These are sometimes called "bad cholesterol." Having high levels of LDL increases your risk for CAD. Very low-density lipoproteins (VLDL) carry triglycerides through the bloodstream and deposit them in  tissues. A high level of VLDL also increases your risk for CAD. High-density lipoproteins (HDL) collect cholesterol from your body's tissues and carry it to your liver. HDL is sometimes called "good cholesterol." Having high levels of HDL decreases your risk for CAD. When all types of lipoproteins are measured together, the test is called a lipid profile test. It allows your health care provider to determine if you are at risk for heart disease or other blood vessel problems. What kind of sample is taken? A blood sample is required for this test. It is usually collected by inserting a needle into a vein or by sticking a finger with a small needle. How do I prepare for this test? You may be asked to avoid eating or drinking anything except water for 12-14 hours before the test. What are the reference ranges? Reference ranges are considered healthy ranges established after testing a large group of healthy people. Reference ranges may vary among different people, labs, and hospitals. It is your responsibility to obtain your test results. Ask the lab or department performing the test when and how you will get your results. Reference ranges for different types of lipoprotein values are as follows:  HDL: ? Female: greater than 45 mg/dL or greater than 0.75 mmol/L (SI units). ? Female: greater than 55 mg/dL or greater than 0.91 mmol/L (SI units).  LDL: ? Adult: less than 130 mg/dL. ? Children: less than 110 mg/dL.  VLDL: 7-32 mg/dL.  What do  the results mean? Results outside the reference range can mean you are at increased risk for CAD. The following is your risk for heart disease according to HDL levels:  Low: ? Men: greater than 60 mg/dL. ? Women: greater than 70 mg/dL.  Moderate: ? Men: 45-59 mg/dL. ? Women: 55-69 mg/dL.  High: ? Men: 25-44 mg/dL. ? Women: 35-54 mg/dL.  Talk with your health care provider to discuss your results, treatment options, and if necessary, the need for more  tests. Talk with your health care provider if you have any questions about your results. Talk with your health care provider to discuss your results, treatment options, and if necessary, the need for more tests. Talk with your health care provider if you have any questions about your results. This information is not intended to replace advice given to you by your health care provider. Make sure you discuss any questions you have with your health care provider. Document Released: 07/23/2004 Document Revised: 02/24/2016 Document Reviewed: 11/08/2013 Elsevier Interactive Patient Education  2018 [employer].

## 2017-08-07 NOTE — Progress Notes (Signed)
Patient ID: Amy Douglas, female    DOB: [birth year], 47 y.o.   MRN: [id number]  Chief Complaint  Patient presents with  . Follow-up    Allergies Hydroxyzine pamoate; Amoxicillin-pot clavulanate; Latex; and Other  Subjective:   Amy Douglas is a 47 y.o. female who presents to [facility] today.  HPI [name] presents today for follow-up of her blood pressure and her migraines.  She reports that she has been taking the HCTZ 25 mg p.o. daily.  She is also been taking the Inderal LA 60 mg p.o. nightly.  She reports that her blood pressure has been running better and she is felt much better.  She reports that she does not feel is tired and just overall feels better.  She reports that her headaches with her migraines over the past month have been much better.  She denies any side effects with the beta-blocker or the HCTZ.  She reports initially she did urinate more frequently but it does not bother her at this time.  She denies any edema in her lower extremities.  No chest pain, shortness of breath, or palpitations.  She reports that her heart is beating well.   Reports that in the past she has had some problems with muscle spasms on the right side of her back.  She reports that over the past several weeks that right side paraspinal muscles of her back spasm at times after walking and working all day. Has had this for years on and off and is used an anti-inflammatory and muscle relaxer when needed. Several years ago, had the trouble with it and then had neck surgery and it got better. Believe it is due to increase walking at work. Walks about 5 miles a day. Drives two hours to and from work each day. Has plantar fasciitis in right foot too, being seen by Dr. Blanch Media. The heel on the right foot is still bothering her. Has had two steroid injections in right foot. Sees him again in 2 days. Does not use heel cups.   She does report that she does not have the best posture.  Outside of  work does not do any exercise or core strengthening.  She denies any numbness or tingling in her upper or lower extremities.  Denies any weakness in her upper and lower extremities.  Urinating normal.  Notices the spasms more of her back gets fatigued from working all day.  Denies any injuries.  Is also here to discuss her cholesterol today.  She does report eating at [employer] at least 5 days a week.  Her FLP was checked at her last visit.  Her HDL was 27.  She does not smoke. Also completed her sleep study since she was here.  She does have a history of heavy snoring.  Her sleep study did reveal obstructive sleep apnea.    Past Medical History:  Diagnosis Date  . Breast cancer [facility])     Past Surgical History:  Procedure Laterality Date  . MASTECTOMY    . maxillofacial Bilateral     Family History  Problem Relation Age of Onset  . Hypertension Mother   . Diabetes Father   . Dementia Father   . Heart disease Father      Social History   Socioeconomic History  . Marital status: Married    Spouse name: None  . Number of children: None  . Years of education: None  . Highest education level: None  Social Needs  .  Financial resource strain: None  . Food insecurity - worry: None  . Food insecurity - inability: None  . Transportation needs - medical: None  . Transportation needs - non-medical: None  Occupational History  . None  Tobacco Use  . Smoking status: Never Smoker  . Smokeless tobacco: Never Used  Substance and Sexual Activity  . Alcohol use: No  . Drug use: No  . Sexual activity: Yes  Other Topics Concern  . None  Social History Narrative  . None    Review of Systems  Constitutional: Negative for activity change, appetite change and fever.  Eyes: Negative for visual disturbance.  Respiratory: Negative for cough, chest tightness and shortness of breath.   Cardiovascular: Negative for chest pain, palpitations and leg swelling.  Gastrointestinal: Negative  for abdominal pain, nausea and vomiting.  Genitourinary: Negative for dysuria, frequency and urgency.  Neurological: Negative for dizziness, syncope and light-headedness.  Hematological: Negative for adenopathy.     Objective:   BP 130/80 (BP Location: Left Arm, Patient Position: Sitting, Cuff Size: Normal)   Pulse 98   Temp (!) 97.1 F (36.2 C) (Temporal)   Resp 16   Ht 5\' 7"  (1.702 m)   Wt 218 lb 4 oz (99 kg)   LMP 07/31/2017 (Approximate)   BMI 34.18 kg/m   Physical Exam  Constitutional: She is oriented to person, place, and time. She appears well-developed and well-nourished. No distress.  HENT:  Head: Normocephalic and atraumatic.  Eyes: Pupils are equal, round, and reactive to light.  Neck: Normal range of motion. Neck supple. No thyromegaly present.  Cardiovascular: Normal rate, regular rhythm and normal heart sounds.  Pulmonary/Chest: Effort normal and breath sounds normal. No respiratory distress.  Neurological: She is alert and oriented to person, place, and time. No cranial nerve deficit.  Skin: Skin is warm and dry.  Nursing note and vitals reviewed.    Assessment and Plan   1. OSA (obstructive sleep apnea) -Results of test discussed with patient.  Will put in order for advanced home care for AutoPap. - DME Other see comment  2. High risk medication use Check today secondary to diuretic use. - Basic metabolic panel  3. Screening for HIV (human immunodeficiency virus)  - HIV antibody  4. Immunization due Vaccine administered today.  Immunization records requested. - Tdap vaccine greater than or equal to 7yo IM  5. Muscle spasm, low back, paraspinal Posture, core strengthening, exercise discussed with patient.  We discussed that during her long car ride postures that would help with her back issues.Patient counseled in detail regarding the risks of medication. Told to call or return to clinic if develop any worrisome signs or symptoms. Patient voiced  understanding.  Sedation precautions given.  She was told she did not take the muscle relaxer if she is planning on driving.  She voiced understanding - cyclobenzaprine (FLEXERIL) 5 MG tablet; Take 1 tablet (5 mg total) by mouth 3 (three) times daily as needed for muscle spasms.  Dispense: 30 tablet; Refill: 1 - meloxicam (MOBIC) 7.5 MG tablet; Take 1 tablet (7.5 mg total) by mouth daily.  Dispense: 30 tablet; Refill: 0 Discussed risks of cardiovascular thrombotic events related to NSAIDS. Discussed increased risk of AMI and CVA. Discussed risk of serious GI adverse events including bleeding, ulcers, and perforation. Patient understands risks of this medication.   6. Anxiety, controlled Stable.  Continue Zoloft. Suicide risks evaluated and documented in note if present or in the area below.  Patient specifically denies  suicide ideation. Patient has access/information to healthcare contacts if situation or mood changes where patient is a risk to self or others or mood becomes unstable.   Patient understands the treatment plan and is in agreement. Agrees to keep follow up and call prior or return to clinic if needed.   7. HTN, goal below 140/90 Blood pressure improved with the addition of HCTZ.  Continue medication as directed.  Follow-up in 6 months.  Diet, exercise, and weight loss discussed with patient.  8. Low HDL (under 40) 10-year ASCVD calculated risk of approximately 2.5%.  Patient is not a candidate for statins at this time however we did discuss the cardiac risk associated with a low HDL.  We discussed foods and lifestyle modifications to aid in increasing her HDL level.  She voiced understanding.  9. Migraines Improved.  Continue Inderal LA as directed.  Compliance with medication discussed.  Counseled concerning worrisome signs and symptoms of headache and if develop contact medical help.  Voiced understanding Return in about 3 months (around 11/04/2017) for Follow-up. [name],  MD 08/07/2017

## 2017-08-07 NOTE — Progress Notes (Signed)
Patient informed at office visit today.

## 2017-08-08 ENCOUNTER — Encounter: Payer: Self-pay | Admitting: Family Medicine

## 2017-08-08 LAB — BASIC METABOLIC PANEL
BUN: 11 mg/dL (ref 7–25)
CALCIUM: 9.1 mg/dL (ref 8.6–10.2)
CO2: 27 mmol/L (ref 20–32)
Chloride: 102 mmol/L (ref 98–110)
Creat: 0.93 mg/dL (ref 0.50–1.10)
GLUCOSE: 105 mg/dL — AB (ref 65–99)
Potassium: 3.7 mmol/L (ref 3.5–5.3)
SODIUM: 136 mmol/L (ref 135–146)

## 2017-08-08 LAB — HIV ANTIBODY (ROUTINE TESTING W REFLEX): HIV: NONREACTIVE

## 2017-09-24 ENCOUNTER — Other Ambulatory Visit: Payer: Self-pay | Admitting: Family Medicine

## 2017-09-24 DIAGNOSIS — I10 Essential (primary) hypertension: Secondary | ICD-10-CM

## 2017-09-26 ENCOUNTER — Other Ambulatory Visit: Payer: Self-pay | Admitting: Family Medicine

## 2017-09-26 DIAGNOSIS — I10 Essential (primary) hypertension: Secondary | ICD-10-CM

## 2017-09-27 MED ORDER — PROPRANOLOL HCL ER 60 MG PO CP24: 60.0000 mg | ORAL_CAPSULE | Freq: Every day | ORAL | 0 refills | Status: DC

## 2017-10-17 ENCOUNTER — Other Ambulatory Visit: Payer: Self-pay | Admitting: Sports Medicine

## 2017-10-17 ENCOUNTER — Telehealth: Payer: Self-pay | Admitting: *Deleted

## 2017-10-17 ENCOUNTER — Ambulatory Visit (INDEPENDENT_AMBULATORY_CARE_PROVIDER_SITE_OTHER): Payer: BLUE CROSS/BLUE SHIELD

## 2017-10-17 ENCOUNTER — Ambulatory Visit (INDEPENDENT_AMBULATORY_CARE_PROVIDER_SITE_OTHER): Payer: BLUE CROSS/BLUE SHIELD | Admitting: Sports Medicine

## 2017-10-17 ENCOUNTER — Encounter: Payer: Self-pay | Admitting: Sports Medicine

## 2017-10-17 VITALS — BP 103/66 | HR 85 | Resp 16

## 2017-10-17 DIAGNOSIS — M79671 Pain in right foot: Secondary | ICD-10-CM | POA: Diagnosis not present

## 2017-10-17 DIAGNOSIS — M7731 Calcaneal spur, right foot: Secondary | ICD-10-CM | POA: Diagnosis not present

## 2017-10-17 DIAGNOSIS — M722 Plantar fascial fibromatosis: Secondary | ICD-10-CM

## 2017-10-17 NOTE — Telephone Encounter (Signed)
-----   Message from Landis Martins, [place] sent at 10/17/2017 11:45 AM EDT ----- Regarding: MRI R Foot  Heel pain over 1 year. Eval plantar fascia

## 2017-10-17 NOTE — Patient Instructions (Signed)

## 2017-10-17 NOTE — Progress Notes (Signed)
Subjective: Amy Douglas is a 47 y.o. female patient presents to office with complaint of moderate heel pain on the right x 1-2 years. 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 Had treatment with Dr. Blanch Media and also Dr. Posey Pronto with no relief. Patient has treated this problem with injection, changing shoes, ice, stretching, insoles, anti-inflammatories and past xrays with no relief. Denies any other pedal complaints.   Review of Systems  Musculoskeletal:       R Heel pain  All other systems reviewed and are negative.    Patient Active Problem List   Diagnosis Date Noted  . HTN, goal below 140/90 08/07/2017  . Low HDL (under 40) 08/07/2017  . Migraine without status migrainosus, not intractable 03/28/2017  . History of migraine headaches 03/28/2017  . Seasonal allergic rhinitis due to pollen 03/28/2017  . Need for immunization against influenza 03/28/2017  . Anxiety 03/28/2017  . Carcinoma of right breast ([place]) 03/31/2015  . Chronic pain syndrome 03/31/2015  . Status post right mastectomy 03/31/2015    Current Outpatient Medications on File Prior to Visit  Medication Sig Dispense Refill  . cyclobenzaprine (FLEXERIL) 5 MG tablet Take 1 tablet (5 mg total) by mouth 3 (three) times daily as needed for muscle spasms. 30 tablet 1  . hydrochlorothiazide (HYDRODIURIL) 25 MG tablet Take 1 tablet (25 mg total) by mouth daily. 90 tablet 3  . meloxicam (MOBIC) 7.5 MG tablet Take 1 tablet (7.5 mg total) by mouth daily. 30 tablet 0  . omeprazole (PRILOSEC) 20 MG capsule Take 20 mg by mouth daily.    . propranolol ER (INDERAL LA) 60 MG 24 hr capsule Take 1 capsule (60 mg total) by mouth daily. 90 capsule 0  . sertraline (ZOLOFT) 100 MG tablet Take 1 tablet (100 mg total) by mouth daily. 90 tablet 1   No current facility-administered medications on file prior to visit.     Allergies  Allergen Reactions  . Hydroxyzine  Pamoate Itching  . Amoxicillin-Pot Clavulanate Rash  . Latex Other (See Comments)    Rash and skin blistering  . Other Other (See Comments)    States she has a bad reaction to all steroids: fever, insomnia, ams  . Diclofenac     Upsets stomach  . Prednisone     AMS     Objective: Physical Exam General: The patient is alert and oriented x3 in no acute distress.  Dermatology: Skin is warm, dry and supple bilateral lower extremities. Nails 1-10 are normal. There is no erythema, edema, no eccymosis, no open lesions present. Integument is otherwise unremarkable.  Vascular: Dorsalis Pedis pulse and Posterior Tibial pulse are 2/4 bilateral. Capillary fill time is immediate to all digits.  Neurological: Grossly intact to light touch with an achilles reflex of +2/5 and a  negative Tinel's sign bilateral.  Musculoskeletal: Tenderness to palpation at the medial calcaneal tubercale and through the insertion of the plantar fascia on the right foot. No pain with compression of calcaneus bilateral. No pain with tuning fork to calcaneus bilateral. No pain with calf compression bilateral. There is decreased Ankle joint range of motion bilateral. All other joints range of motion within normal limits bilateral. Strength 5/5 in all groups bilateral.   Gait: Unassisted, Antalgic avoid weight on right heel  Xray, Right foot:  Normal osseous mineralization. Joint spaces preserved. No fracture/dislocation/boney destruction. Inferior and Posterior Calcaneal spur present with mild thickening of plantar fascia.  No other soft tissue abnormalities or radiopaque foreign bodies.   Assessment and Plan: Problem List Items Addressed This Visit    None    Visit Diagnoses    Plantar fasciitis    -  Primary   Foot pain, right       Relevant Orders   DG Foot Complete Right   Heel spur, right          -Complete examination performed.  -Xrays reviewed -Discussed with patient in detail the condition of plantar  fasciitis, how this occurs and general treatment options. Explained both conservative and surgical treatments.  -Rx MRI for further eval; Advised patient to consider EPAT vs Sx  -No work if can not make accommodations for patient to use CAM boot until after her MRI is done and treatment plan is established  -Continue with CAM boot -Recommend patient to gently stretch and to ice affected area 1-2x daily. -Patient to return to office after MRI for follow up or sooner if problems or questions arise.  Landis Martins, DPM

## 2017-10-18 NOTE — Progress Notes (Signed)
Order # [id number]  MRI approved 10-18-17 to 12-16-17

## 2017-10-26 ENCOUNTER — Other Ambulatory Visit: Payer: Self-pay | Admitting: Family Medicine

## 2017-10-26 DIAGNOSIS — M62838 Other muscle spasm: Secondary | ICD-10-CM

## 2017-10-30 ENCOUNTER — Telehealth: Payer: Self-pay | Admitting: Sports Medicine

## 2017-10-30 NOTE — Telephone Encounter (Signed)
Pt's son, states pt would like me to call her on her cellphone [phone].

## 2017-10-30 NOTE — Telephone Encounter (Signed)
Patient is waiting on a order to be sent over to [place] for MRI. Please call back at [phone]

## 2017-10-30 NOTE — Telephone Encounter (Signed)
I spoke with pt and told her possibly the reason [facility] had not called is that they have has some illness in the scheduling staff and I would give her their appt line number. Pt states she has called them and they state they do not have the orders. I apologized and told her our office was told not to fax the orders because they work from the que from our office orders and not to fax. I told pt I would fax the orders immediately and she should be able to schedule.

## 2017-11-02 ENCOUNTER — Ambulatory Visit (HOSPITAL_COMMUNITY): Payer: BLUE CROSS/BLUE SHIELD | Admitting: Hematology

## 2017-11-02 ENCOUNTER — Ambulatory Visit
Admission: RE | Admit: 2017-11-02 | Discharge: 2017-11-02 | Disposition: A | Discharge status: Home / Self Care | Payer: BLUE CROSS/BLUE SHIELD | Source: Ambulatory Visit | Attending: Sports Medicine | Admitting: Sports Medicine

## 2017-11-02 ENCOUNTER — Encounter: Payer: Self-pay | Admitting: Sports Medicine

## 2017-11-02 DIAGNOSIS — M79671 Pain in right foot: Secondary | ICD-10-CM

## 2017-11-14 ENCOUNTER — Ambulatory Visit (INDEPENDENT_AMBULATORY_CARE_PROVIDER_SITE_OTHER): Payer: BLUE CROSS/BLUE SHIELD | Admitting: Sports Medicine

## 2017-11-14 ENCOUNTER — Encounter: Payer: Self-pay | Admitting: Sports Medicine

## 2017-11-14 ENCOUNTER — Encounter

## 2017-11-14 DIAGNOSIS — M722 Plantar fascial fibromatosis: Secondary | ICD-10-CM

## 2017-11-14 DIAGNOSIS — S86319A Strain of muscle(s) and tendon(s) of peroneal muscle group at lower leg level, unspecified leg, initial encounter: Secondary | ICD-10-CM | POA: Diagnosis not present

## 2017-11-14 DIAGNOSIS — M7731 Calcaneal spur, right foot: Secondary | ICD-10-CM

## 2017-11-14 DIAGNOSIS — M79671 Pain in right foot: Secondary | ICD-10-CM

## 2017-11-14 NOTE — Patient Instructions (Signed)
Pre-Operative Instructions  Congratulations, you have decided to take an important step towards improving your quality of life.  You can be assured that the doctors and staff at [facility] will be with you every step of the way.  Here are some important things you should know:  1. Plan to be at the surgery center/hospital at least 1 (one) hour prior to your scheduled time, unless otherwise directed by the surgical center/hospital staff.  You must have a responsible adult accompany you, remain during the surgery and drive you home.  Make sure you have directions to the surgical center/hospital to ensure you arrive on time. 2. If you are having surgery at [facility] or [facility] hospitals, you will need a copy of your medical history and physical form from your family physician within one month prior to the date of surgery. We will give you a form for your primary physician to complete.  3. We make every effort to accommodate the date you request for surgery.  However, there are times where surgery dates or times have to be moved.  We will contact you as soon as possible if a change in schedule is required.   4. No aspirin/ibuprofen for one week before surgery.  If you are on aspirin, any non-steroidal anti-inflammatory medications (Mobic, Aleve, Ibuprofen) should not be taken seven (7) days prior to your surgery.  You make take Tylenol for pain prior to surgery.  5. Medications - If you are taking daily heart and blood pressure medications, seizure, reflux, allergy, asthma, anxiety, pain or diabetes medications, make sure you notify the surgery center/hospital before the day of surgery so they can tell you which medications you should take or avoid the day of surgery. 6. No food or drink after midnight the night before surgery unless directed otherwise by surgical center/hospital staff. 7. No alcoholic beverages 24-hours prior to surgery.  No smoking 24-hours prior or 24-hours after  surgery. 8. Wear loose pants or shorts. They should be loose enough to fit over bandages, boots, and casts. 9. Don't wear slip-on shoes. Sneakers are preferred. 10. Bring your boot with you to the surgery center/hospital.  Also bring crutches or a walker if your physician has prescribed it for you.  If you do not have this equipment, it will be provided for you after surgery. 11. If you have not been contacted by the surgery center/hospital by the day before your surgery, call to confirm the date and time of your surgery. 12. Leave-time from work may vary depending on the type of surgery you have.  Appropriate arrangements should be made prior to surgery with your employer. 13. Prescriptions will be provided immediately following surgery by your doctor.  Fill these as soon as possible after surgery and take the medication as directed. Pain medications will not be refilled on weekends and must be approved by the doctor. 14. Remove nail polish on the operative foot and avoid getting pedicures prior to surgery. 15. Wash the night before surgery.  The night before surgery wash the foot and leg well with water and the antibacterial soap provided. Be sure to pay special attention to beneath the toenails and in between the toes.  Wash for at least three (3) minutes. Rinse thoroughly with water and dry well with a towel.  Perform this wash unless told not to do so by your physician.  Enclosed: 1 Ice pack (please put in freezer the night before surgery)   1 Hibiclens skin cleaner     Pre-op instructions  If you have any questions regarding the instructions, please do not hesitate to call our office.  [place]: [address], [place], [place] [address] -- [phone]  [place]: [address] , [place] [address] -- [phone]  [place]: [address]  [place], [place] [address] -- [phone]  [place]: [address], [address], [place],  [address] -- [phone]  Website:  [email or web address] 

## 2017-11-14 NOTE — Progress Notes (Signed)
Subjective: Amy Douglas is a 47 y.o. female patient returns to office for follow eval of right heel pain and for discussion of MRI results. Patient has been in CAM boot still with pain. Denies any other pedal complaints.    Patient Active Problem List   Diagnosis Date Noted  . HTN, goal below 140/90 08/07/2017  . Low HDL (under 40) 08/07/2017  . Migraine without status migrainosus, not intractable 03/28/2017  . History of migraine headaches 03/28/2017  . Seasonal allergic rhinitis due to pollen 03/28/2017  . Need for immunization against influenza 03/28/2017  . Anxiety 03/28/2017  . Port-A-Cath in place 04/09/2015  . Carcinoma of right breast ([place]) 03/31/2015  . Chronic pain syndrome 03/31/2015  . Status post right mastectomy 03/31/2015    Current Outpatient Medications on File Prior to Visit  Medication Sig Dispense Refill  . cyclobenzaprine (FLEXERIL) 5 MG tablet Take 1 tablet (5 mg total) by mouth 3 (three) times daily as needed for muscle spasms. 30 tablet 1  . hydrochlorothiazide (HYDRODIURIL) 25 MG tablet Take 1 tablet (25 mg total) by mouth daily. 90 tablet 3  . meloxicam (MOBIC) 7.5 MG tablet TAKE 1 TABLET BY MOUTH ONCE DAILY 30 tablet 0  . omeprazole (PRILOSEC) 20 MG capsule Take 20 mg by mouth daily.    . propranolol ER (INDERAL LA) 60 MG 24 hr capsule Take 1 capsule (60 mg total) by mouth daily. 90 capsule 0  . sertraline (ZOLOFT) 100 MG tablet Take 1 tablet (100 mg total) by mouth daily. 90 tablet 1   No current facility-administered medications on file prior to visit.     Allergies  Allergen Reactions  . Hydroxyzine Pamoate Itching  . Amoxicillin-Pot Clavulanate Rash  . Latex Other (See Comments)    Rash and skin blistering  . Other Other (See Comments)    States she has a bad reaction to all steroids: fever, insomnia, ams  . Diclofenac     Upsets stomach  . Prednisone     AMS     Objective: Physical Exam General: The patient is alert and oriented x3 in  no acute distress.  Dermatology: Skin is warm, dry and supple bilateral lower extremities. Nails 1-10 are normal. There is no erythema, edema, no eccymosis, no open lesions present. Integument is otherwise unremarkable.  Vascular: Dorsalis Pedis pulse and Posterior Tibial pulse are 2/4 bilateral. Capillary fill time is immediate to all digits.  Neurological: Grossly intact to light touch with an achilles reflex of +2/5 and a  negative Tinel's sign bilateral.  Musculoskeletal: Continued tenderness to palpation at the medial calcaneal tubercale and through the insertion of the plantar fascia on the right foot and lateral heel on right. No pain with compression of calcaneus bilateral. No pain with tuning fork to calcaneus bilateral. No pain with calf compression bilateral. There is decreased Ankle joint range of motion bilateral. All other joints range of motion within normal limits bilateral. Strength 5/5 in all groups bilateral.   Gait: CAM boot assisted, Antalgic   Assessment and Plan: Problem List Items Addressed This Visit    None    Visit Diagnoses    Plantar fasciitis    -  Primary   Heel spur, right       Tear of peroneal tendon, initial encounter       Right foot pain          -Complete examination performed.  -MRI reviewed: Fasciitis, Moderate tendinosis of the peroneus brevis with a longitudinal split tear.  Moderate amount of fluid in the peroneal tendon sheath. -Discussed with patient in detail the condition of plantar fasciitis with peroneal tear and synovitis, how this occurs and general treatment options. Explained both conservative and surgical treatments.  -Patient opt for surgical management. Consent obtained for Right EPF with heel spur resection and peroneal tendon repair. Pre and Post op course explained. Risks, benefits, alternatives explained. No guarantees given or implied. Surgical booking slip submitted and provided patient with Surgical packet and info for  [place] -Continue with CAM boot until time for surgery. Patient is aware will be nonweightbearing 3-4 weeks after surgery at minimum -Patient to return to office after surgery or sooner if problems or questions arise.  Landis Martins, DPM

## 2017-11-20 ENCOUNTER — Encounter: Payer: Self-pay | Admitting: Sports Medicine

## 2017-11-20 ENCOUNTER — Other Ambulatory Visit: Payer: Self-pay | Admitting: Sports Medicine

## 2017-11-20 DIAGNOSIS — M722 Plantar fascial fibromatosis: Secondary | ICD-10-CM

## 2017-11-20 DIAGNOSIS — M7731 Calcaneal spur, right foot: Secondary | ICD-10-CM | POA: Diagnosis not present

## 2017-11-20 DIAGNOSIS — Z9889 Other specified postprocedural states: Secondary | ICD-10-CM

## 2017-11-20 DIAGNOSIS — M65871 Other synovitis and tenosynovitis, right ankle and foot: Secondary | ICD-10-CM | POA: Diagnosis not present

## 2017-11-20 DIAGNOSIS — M79671 Pain in right foot: Secondary | ICD-10-CM

## 2017-11-20 DIAGNOSIS — S86319A Strain of muscle(s) and tendon(s) of peroneal muscle group at lower leg level, unspecified leg, initial encounter: Secondary | ICD-10-CM

## 2017-11-20 NOTE — Progress Notes (Signed)
Rx Knee Scooter NWB on R foot post op

## 2017-11-21 ENCOUNTER — Telehealth: Payer: Self-pay | Admitting: *Deleted

## 2017-11-21 ENCOUNTER — Ambulatory Visit: Payer: BLUE CROSS/BLUE SHIELD | Admitting: Sports Medicine

## 2017-11-21 ENCOUNTER — Telehealth: Payer: Self-pay | Admitting: Sports Medicine

## 2017-11-21 NOTE — Telephone Encounter (Signed)
Post op check phone call made to patient. Patient did not answer. Left voicemail with call back # with instructions to call office if there are any problems or concerns -Dr. Cannon Kettle

## 2017-11-21 NOTE — Telephone Encounter (Signed)
Amy Douglas - [facility] states the Percocet 10/325mg  is greater than the 37mEq, can fill as one tablet every 6 hours no more than 3 tablets/day. I told Amy Douglas that would be fine.

## 2017-11-28 ENCOUNTER — Ambulatory Visit (INDEPENDENT_AMBULATORY_CARE_PROVIDER_SITE_OTHER): Payer: BLUE CROSS/BLUE SHIELD

## 2017-11-28 ENCOUNTER — Encounter: Payer: Self-pay | Admitting: Sports Medicine

## 2017-11-28 ENCOUNTER — Ambulatory Visit (INDEPENDENT_AMBULATORY_CARE_PROVIDER_SITE_OTHER): Payer: BLUE CROSS/BLUE SHIELD | Admitting: Sports Medicine

## 2017-11-28 VITALS — BP 122/76 | HR 92 | Temp 99.7°F | Resp 16

## 2017-11-28 DIAGNOSIS — M7731 Calcaneal spur, right foot: Secondary | ICD-10-CM

## 2017-11-28 DIAGNOSIS — M79671 Pain in right foot: Secondary | ICD-10-CM

## 2017-11-28 DIAGNOSIS — M722 Plantar fascial fibromatosis: Secondary | ICD-10-CM

## 2017-11-28 DIAGNOSIS — Z9889 Other specified postprocedural states: Secondary | ICD-10-CM

## 2017-11-28 DIAGNOSIS — S86319D Strain of muscle(s) and tendon(s) of peroneal muscle group at lower leg level, unspecified leg, subsequent encounter: Secondary | ICD-10-CM

## 2017-11-28 MED ORDER — OXYCODONE-ACETAMINOPHEN 10-325 MG PO TABS: 1.0000 | ORAL_TABLET | Freq: Three times a day (TID) | ORAL | 0 refills | Status: DC | PRN

## 2017-11-28 NOTE — Progress Notes (Signed)
Subjective: Amy Douglas is a 47 y.o. female patient seen today in office for POV #1 (DOS 11-20-17, S/P R EPF with Peroneal tendon repair. Patient states that she is partly numb from block a surgical site, 3/10 when sitting sometimes and 6/10 with leg down, denies calf pain, denies headache, chest pain, shortness of breath, nausea, vomiting, fever, or chills. Patient states that she needs a refill on pain medication and is slowly trying to wean from pain medication. No other issues noted.   Patient Active Problem List   Diagnosis Date Noted  . HTN, goal below 140/90 08/07/2017  . Low HDL (under 40) 08/07/2017  . Migraine without status migrainosus, not intractable 03/28/2017  . History of migraine headaches 03/28/2017  . Seasonal allergic rhinitis due to pollen 03/28/2017  . Need for immunization against influenza 03/28/2017  . Anxiety 03/28/2017  . Port-A-Cath in place 04/09/2015  . Carcinoma of right breast ([place]) 03/31/2015  . Chronic pain syndrome 03/31/2015  . Status post right mastectomy 03/31/2015    Current Outpatient Medications on File Prior to Visit  Medication Sig Dispense Refill  . cyclobenzaprine (FLEXERIL) 5 MG tablet Take 1 tablet (5 mg total) by mouth 3 (three) times daily as needed for muscle spasms. 30 tablet 1  . hydrochlorothiazide (HYDRODIURIL) 25 MG tablet Take 1 tablet (25 mg total) by mouth daily. 90 tablet 3  . meloxicam (MOBIC) 7.5 MG tablet TAKE 1 TABLET BY MOUTH ONCE DAILY 30 tablet 0  . omeprazole (PRILOSEC) 20 MG capsule Take 20 mg by mouth daily.    . propranolol ER (INDERAL LA) 60 MG 24 hr capsule Take 1 capsule (60 mg total) by mouth daily. 90 capsule 0  . sertraline (ZOLOFT) 100 MG tablet Take 1 tablet (100 mg total) by mouth daily. 90 tablet 1  . promethazine (PHENERGAN) 25 MG tablet Take 25 mg by mouth every 8 (eight) hours as needed. for nausea  0   No current facility-administered medications on file prior to visit.     Allergies  Allergen  Reactions  . Hydroxyzine Pamoate Itching  . Amoxicillin-Pot Clavulanate Rash  . Latex Other (See Comments)    Rash and skin blistering  . Other Other (See Comments)    States she has a bad reaction to all steroids: fever, insomnia, ams  . Diclofenac     Upsets stomach  . Prednisone     AMS    Objective: There were no vitals filed for this visit.  General: No acute distress, AAOx3  Right foot: Sutures intact with no gapping or dehiscence at surgical sites, mild swelling to right heel and lateral ankle, no erythema, no warmth, no drainage, no signs of infection noted, Capillary fill time <3 seconds in all digits, gross sensation present via light touch to right foot. No pain or crepitation with range of motion right foot.  No pain with calf compression.   Post Op Xray, Right foot: S/p heel spur reduction. Soft tissue swelling within normal limits for post op status.   Assessment and Plan:  Problem List Items Addressed This Visit    None    Visit Diagnoses    Heel spur, right    -  Primary   Relevant Orders   DG Foot Complete Right   Plantar fasciitis       Right foot pain       Peroneal tendon rupture, subsequent encounter       S/P foot surgery, right           -  Patient seen and evaluated -Xrays reviewed  -Re-Applied posterior splint and dry sterile dressing to surgical site right foot secured with ACE wrap and stockinet  -Advised patient to make sure to keep dressings clean, dry, and intact to right surgical site -Continue NWB expect total of 1 month NWB then partial with boot and PT  -Advised patient to limit activity to necessity  -Advised patient to ice and elevate  -Will plan for possible suture removal at next office visit. In the meantime, patient to call office if any issues or problems arise.   Landis Martins, DPM

## 2017-12-05 ENCOUNTER — Ambulatory Visit (INDEPENDENT_AMBULATORY_CARE_PROVIDER_SITE_OTHER): Payer: BLUE CROSS/BLUE SHIELD | Admitting: Sports Medicine

## 2017-12-05 ENCOUNTER — Encounter: Payer: Self-pay | Admitting: Sports Medicine

## 2017-12-05 DIAGNOSIS — S86319D Strain of muscle(s) and tendon(s) of peroneal muscle group at lower leg level, unspecified leg, subsequent encounter: Secondary | ICD-10-CM

## 2017-12-05 DIAGNOSIS — Z9889 Other specified postprocedural states: Secondary | ICD-10-CM

## 2017-12-05 DIAGNOSIS — M722 Plantar fascial fibromatosis: Secondary | ICD-10-CM

## 2017-12-05 DIAGNOSIS — M7731 Calcaneal spur, right foot: Secondary | ICD-10-CM

## 2017-12-05 DIAGNOSIS — M79671 Pain in right foot: Secondary | ICD-10-CM

## 2017-12-05 NOTE — Progress Notes (Signed)
Subjective: Amy Douglas is a 47 y.o. female patient seen today in office for POV #2 (DOS 11-20-17, S/P R EPF with Peroneal tendon repair. Patient states that she is doing ok, denies calf pain, denies headache, chest pain, shortness of breath, nausea, vomiting, fever, or chills. No other issues noted.   Patient Active Problem List   Diagnosis Date Noted  . HTN, goal below 140/90 08/07/2017  . Low HDL (under 40) 08/07/2017  . Migraine without status migrainosus, not intractable 03/28/2017  . History of migraine headaches 03/28/2017  . Seasonal allergic rhinitis due to pollen 03/28/2017  . Need for immunization against influenza 03/28/2017  . Anxiety 03/28/2017  . Port-A-Cath in place 04/09/2015  . Carcinoma of right breast ([place]) 03/31/2015  . Chronic pain syndrome 03/31/2015  . Status post right mastectomy 03/31/2015    Current Outpatient Medications on File Prior to Visit  Medication Sig Dispense Refill  . cyclobenzaprine (FLEXERIL) 5 MG tablet Take 1 tablet (5 mg total) by mouth 3 (three) times daily as needed for muscle spasms. 30 tablet 1  . hydrochlorothiazide (HYDRODIURIL) 25 MG tablet Take 1 tablet (25 mg total) by mouth daily. 90 tablet 3  . meloxicam (MOBIC) 7.5 MG tablet TAKE 1 TABLET BY MOUTH ONCE DAILY 30 tablet 0  . omeprazole (PRILOSEC) 20 MG capsule Take 20 mg by mouth daily.    Marland Kitchen oxyCODONE-acetaminophen (PERCOCET) 10-325 MG tablet Take 1 tablet by mouth every 8 (eight) hours as needed for pain. 15 tablet 0  . promethazine (PHENERGAN) 25 MG tablet Take 25 mg by mouth every 8 (eight) hours as needed. for nausea  0  . propranolol ER (INDERAL LA) 60 MG 24 hr capsule Take 1 capsule (60 mg total) by mouth daily. 90 capsule 0  . sertraline (ZOLOFT) 100 MG tablet Take 1 tablet (100 mg total) by mouth daily. 90 tablet 1   No current facility-administered medications on file prior to visit.     Allergies  Allergen Reactions  . Hydroxyzine Pamoate Itching  . Amoxicillin-Pot  Clavulanate Rash  . Latex Other (See Comments)    Rash and skin blistering  . Other Other (See Comments)    States she has a bad reaction to all steroids: fever, insomnia, ams  . Diclofenac     Upsets stomach  . Prednisone     AMS    Objective: There were no vitals filed for this visit.  General: No acute distress, AAOx3  Right foot: Sutures intact with no gapping or dehiscence at surgical sites, mild swelling to right heel and lateral ankle, no erythema, no warmth, no drainage, no signs of infection noted, Capillary fill time <3 seconds in all digits, gross sensation present via light touch to right foot. No pain or crepitation with range of motion right foot.  No pain with calf compression.   Assessment and Plan:  Problem List Items Addressed This Visit    None    Visit Diagnoses    S/P foot surgery, right    -  Primary   Heel spur, right       Plantar fasciitis       Right foot pain       Peroneal tendon rupture, subsequent encounter           -Patient seen and evaluated -A few sutures were removed -Patient may use CAM boot (treat like a cast with no weight on foot) and dry sterile dressing to surgical site right foot secured with ACE wrap and stockinet  -  Advised patient to make sure to keep dressings clean, dry, and intact to right surgical site -Continue NWB expect total of 3 more weeks NWB then partial with boot and PT  -Advised patient to limit activity to necessity  -Advised patient to ice and elevate  -Will plan for finishing suture removal at next office visit. In the meantime, patient to call office if any issues or problems arise.   Landis Martins, DPM

## 2017-12-08 ENCOUNTER — Encounter: Payer: Self-pay | Admitting: Family Medicine

## 2017-12-12 ENCOUNTER — Ambulatory Visit (INDEPENDENT_AMBULATORY_CARE_PROVIDER_SITE_OTHER): Payer: Self-pay | Admitting: Sports Medicine

## 2017-12-12 DIAGNOSIS — S86319D Strain of muscle(s) and tendon(s) of peroneal muscle group at lower leg level, unspecified leg, subsequent encounter: Secondary | ICD-10-CM

## 2017-12-12 DIAGNOSIS — M7731 Calcaneal spur, right foot: Secondary | ICD-10-CM

## 2017-12-12 DIAGNOSIS — Z9889 Other specified postprocedural states: Secondary | ICD-10-CM

## 2017-12-12 DIAGNOSIS — M79671 Pain in right foot: Secondary | ICD-10-CM

## 2017-12-12 DIAGNOSIS — M722 Plantar fascial fibromatosis: Secondary | ICD-10-CM

## 2017-12-12 NOTE — Progress Notes (Signed)
Subjective: Amy Douglas is a 47 y.o. female patient seen today in office for POV #3 (DOS 11-20-17, S/P R EPF with Peroneal tendon repair. Patient states that she is doing ok, no pain, denies calf pain, denies headache, chest pain, shortness of breath, nausea, vomiting, fever, or chills. No other issues noted.   Patient Active Problem List   Diagnosis Date Noted  . HTN, goal below 140/90 08/07/2017  . Low HDL (under 40) 08/07/2017  . Migraine without status migrainosus, not intractable 03/28/2017  . History of migraine headaches 03/28/2017  . Seasonal allergic rhinitis due to pollen 03/28/2017  . Need for immunization against influenza 03/28/2017  . Anxiety 03/28/2017  . Port-A-Cath in place 04/09/2015  . Carcinoma of right breast ([place]) 03/31/2015  . Chronic pain syndrome 03/31/2015  . Status post right mastectomy 03/31/2015    Current Outpatient Medications on File Prior to Visit  Medication Sig Dispense Refill  . cyclobenzaprine (FLEXERIL) 5 MG tablet Take 1 tablet (5 mg total) by mouth 3 (three) times daily as needed for muscle spasms. 30 tablet 1  . hydrochlorothiazide (HYDRODIURIL) 25 MG tablet Take 1 tablet (25 mg total) by mouth daily. 90 tablet 3  . meloxicam (MOBIC) 7.5 MG tablet TAKE 1 TABLET BY MOUTH ONCE DAILY 30 tablet 0  . omeprazole (PRILOSEC) 20 MG capsule Take 20 mg by mouth daily.    Marland Kitchen oxyCODONE-acetaminophen (PERCOCET) 10-325 MG tablet Take 1 tablet by mouth every 8 (eight) hours as needed for pain. 15 tablet 0  . promethazine (PHENERGAN) 25 MG tablet Take 25 mg by mouth every 8 (eight) hours as needed. for nausea  0  . propranolol ER (INDERAL LA) 60 MG 24 hr capsule Take 1 capsule (60 mg total) by mouth daily. 90 capsule 0  . sertraline (ZOLOFT) 100 MG tablet Take 1 tablet (100 mg total) by mouth daily. 90 tablet 1   No current facility-administered medications on file prior to visit.     Allergies  Allergen Reactions  . Hydroxyzine Pamoate Itching  .  Amoxicillin-Pot Clavulanate Rash  . Latex Other (See Comments)    Rash and skin blistering  . Other Other (See Comments)    States she has a bad reaction to all steroids: fever, insomnia, ams  . Diclofenac     Upsets stomach  . Prednisone     AMS    Objective: There were no vitals filed for this visit.  General: No acute distress, AAOx3  Right foot: Sutures intact with no gapping or dehiscence at surgical sites, mild swelling to right heel and lateral ankle, no erythema, no warmth, no drainage, no signs of infection noted, Capillary fill time <3 seconds in all digits, gross sensation present via light touch to right foot. No pain or crepitation with range of motion right foot.  No pain with calf compression.   Assessment and Plan:  Problem List Items Addressed This Visit    None    Visit Diagnoses    Heel spur, right    -  Primary   Plantar fasciitis       Right foot pain       Peroneal tendon rupture, subsequent encounter       S/P foot surgery, right           -Patient seen and evaluated -A few sutures were removed and EPF incision and left remaining intact at lateral ankle -Patient continue CAM boot and NWB until all sutures are removed -Advised patient to make sure to  keep dressings clean, dry, and intact to right surgical site -Continue NWB expect total of 2 more weeks NWB then partial with boot and PT  -Advised patient to limit activity to necessity  -Advised patient to ice and elevate  -Will plan for finishing suture removal at next office visit. In the meantime, patient to call office if any issues or problems arise.   Landis Martins, DPM

## 2017-12-19 ENCOUNTER — Encounter: Payer: Self-pay | Admitting: Sports Medicine

## 2017-12-19 ENCOUNTER — Ambulatory Visit (INDEPENDENT_AMBULATORY_CARE_PROVIDER_SITE_OTHER): Payer: BLUE CROSS/BLUE SHIELD | Admitting: Sports Medicine

## 2017-12-19 ENCOUNTER — Other Ambulatory Visit: Payer: BLUE CROSS/BLUE SHIELD

## 2017-12-19 DIAGNOSIS — Z9889 Other specified postprocedural states: Secondary | ICD-10-CM

## 2017-12-19 DIAGNOSIS — M7731 Calcaneal spur, right foot: Secondary | ICD-10-CM

## 2017-12-19 DIAGNOSIS — M79671 Pain in right foot: Secondary | ICD-10-CM

## 2017-12-19 NOTE — Progress Notes (Signed)
Subjective: Amy Douglas is a 47 y.o. female patient seen today in office for POV #4 (DOS 11-20-17, S/P R EPF with Peroneal tendon repair. Patient states that she is doing good, no pain, denies calf pain, denies headache, chest pain, shortness of breath, nausea, vomiting, fever, or chills. No other issues noted.   Patient Active Problem List   Diagnosis Date Noted  . HTN, goal below 140/90 08/07/2017  . Low HDL (under 40) 08/07/2017  . Migraine without status migrainosus, not intractable 03/28/2017  . History of migraine headaches 03/28/2017  . Seasonal allergic rhinitis due to pollen 03/28/2017  . Need for immunization against influenza 03/28/2017  . Anxiety 03/28/2017  . Port-A-Cath in place 04/09/2015  . Carcinoma of right breast ([place]) 03/31/2015  . Chronic pain syndrome 03/31/2015  . Status post right mastectomy 03/31/2015    Current Outpatient Medications on File Prior to Visit  Medication Sig Dispense Refill  . cyclobenzaprine (FLEXERIL) 5 MG tablet Take 1 tablet (5 mg total) by mouth 3 (three) times daily as needed for muscle spasms. 30 tablet 1  . hydrochlorothiazide (HYDRODIURIL) 25 MG tablet Take 1 tablet (25 mg total) by mouth daily. 90 tablet 3  . meloxicam (MOBIC) 7.5 MG tablet TAKE 1 TABLET BY MOUTH ONCE DAILY 30 tablet 0  . omeprazole (PRILOSEC) 20 MG capsule Take 20 mg by mouth daily.    Marland Kitchen oxyCODONE-acetaminophen (PERCOCET) 10-325 MG tablet Take 1 tablet by mouth every 8 (eight) hours as needed for pain. 15 tablet 0  . promethazine (PHENERGAN) 25 MG tablet Take 25 mg by mouth every 8 (eight) hours as needed. for nausea  0  . propranolol ER (INDERAL LA) 60 MG 24 hr capsule Take 1 capsule (60 mg total) by mouth daily. 90 capsule 0  . sertraline (ZOLOFT) 100 MG tablet Take 1 tablet (100 mg total) by mouth daily. 90 tablet 1   No current facility-administered medications on file prior to visit.     Allergies  Allergen Reactions  . Hydroxyzine Pamoate Itching  .  Amoxicillin-Pot Clavulanate Rash  . Latex Other (See Comments)    Rash and skin blistering  . Other Other (See Comments)    States she has a bad reaction to all steroids: fever, insomnia, ams  . Diclofenac     Upsets stomach  . Prednisone     AMS    Objective: There were no vitals filed for this visit.  General: No acute distress, AAOx3  Right foot: Sutures intact with no gapping or dehiscence at surgical sites, mild swelling to right heel and lateral ankle, no erythema, no warmth, no drainage, no signs of infection noted, Capillary fill time <3 seconds in all digits, gross sensation present via light touch to right foot. No pain or crepitation with range of motion right foot.  No pain with calf compression.   Assessment and Plan:  Problem List Items Addressed This Visit    None    Visit Diagnoses    S/P foot surgery, right    -  Primary   Heel spur, right       Right foot pain          -Patient seen and evaluated -All remaining Sutures removed -May partial weightbear with CAM boot, toe touch only -Advised patient to may shower on tomorrow -Advised patient to limit activity to necessity  -Advised patient to ice and elevate  -Will plan for full weightbearing with CAM boot and possible Rx for PT at next office visit.  In the meantime, patient to call office if any issues or problems arise.   Landis Martins, DPM

## 2018-01-02 ENCOUNTER — Encounter: Payer: BLUE CROSS/BLUE SHIELD | Admitting: Sports Medicine

## 2018-01-09 ENCOUNTER — Ambulatory Visit (INDEPENDENT_AMBULATORY_CARE_PROVIDER_SITE_OTHER): Payer: BLUE CROSS/BLUE SHIELD | Admitting: Sports Medicine

## 2018-01-09 ENCOUNTER — Encounter: Payer: Self-pay | Admitting: Sports Medicine

## 2018-01-09 DIAGNOSIS — M722 Plantar fascial fibromatosis: Secondary | ICD-10-CM

## 2018-01-09 DIAGNOSIS — M79671 Pain in right foot: Secondary | ICD-10-CM

## 2018-01-09 DIAGNOSIS — Z9889 Other specified postprocedural states: Secondary | ICD-10-CM

## 2018-01-09 DIAGNOSIS — M7731 Calcaneal spur, right foot: Secondary | ICD-10-CM

## 2018-01-09 NOTE — Progress Notes (Signed)
Subjective: Amy Douglas is a 47 y.o. female patient seen today in office for POV #5 (DOS 11-20-17, S/P R EPF with Peroneal tendon repair. Patient states that she is doing good, no pain, doing home exercises with no issues, denies calf pain, denies headache, chest pain, shortness of breath, nausea, vomiting, fever, or chills. No other issues noted.   Patient Active Problem List   Diagnosis Date Noted  . HTN, goal below 140/90 08/07/2017  . Low HDL (under 40) 08/07/2017  . Migraine without status migrainosus, not intractable 03/28/2017  . History of migraine headaches 03/28/2017  . Seasonal allergic rhinitis due to pollen 03/28/2017  . Need for immunization against influenza 03/28/2017  . Anxiety 03/28/2017  . Port-A-Cath in place 04/09/2015  . Carcinoma of right breast ([place]) 03/31/2015  . Chronic pain syndrome 03/31/2015  . Status post right mastectomy 03/31/2015    Current Outpatient Medications on File Prior to Visit  Medication Sig Dispense Refill  . cyclobenzaprine (FLEXERIL) 5 MG tablet Take 1 tablet (5 mg total) by mouth 3 (three) times daily as needed for muscle spasms. 30 tablet 1  . hydrochlorothiazide (HYDRODIURIL) 25 MG tablet Take 1 tablet (25 mg total) by mouth daily. 90 tablet 3  . meloxicam (MOBIC) 7.5 MG tablet TAKE 1 TABLET BY MOUTH ONCE DAILY 30 tablet 0  . omeprazole (PRILOSEC) 20 MG capsule Take 20 mg by mouth daily.    Marland Kitchen oxyCODONE-acetaminophen (PERCOCET) 10-325 MG tablet Take 1 tablet by mouth every 8 (eight) hours as needed for pain. 15 tablet 0  . promethazine (PHENERGAN) 25 MG tablet Take 25 mg by mouth every 8 (eight) hours as needed. for nausea  0  . propranolol ER (INDERAL LA) 60 MG 24 hr capsule Take 1 capsule (60 mg total) by mouth daily. 90 capsule 0  . sertraline (ZOLOFT) 100 MG tablet Take 1 tablet (100 mg total) by mouth daily. 90 tablet 1   No current facility-administered medications on file prior to visit.     Allergies  Allergen Reactions  .  Hydroxyzine Pamoate Itching  . Amoxicillin-Pot Clavulanate Rash  . Latex Other (See Comments)    Rash and skin blistering  . Other Other (See Comments)    States she has a bad reaction to all steroids: fever, insomnia, ams  . Diclofenac     Upsets stomach  . Prednisone     AMS    Objective: There were no vitals filed for this visit.  General: No acute distress, AAOx3  Right foot: Incisions well healed at surgical sites, mild swelling to right heel and lateral ankle, no erythema, no warmth, no drainage, no signs of infection noted, Capillary fill time <3 seconds in all digits, gross sensation present via light touch to right foot. Pinpoint numbness at fibula on right. No pain or crepitation with range of motion right foot.  No pain with calf compression.   Assessment and Plan:  Problem List Items Addressed This Visit    None    Visit Diagnoses    S/P foot surgery, right    -  Primary   Heel spur, right       Right foot pain       Plantar fasciitis          -Patient seen and evaluated -Surgical site well healed -Patient now may full weightbear with boot and over the next few weeks slowly wean to tennis shoe -Continue with home PT -Advised patient to ice and elevate as needed -Advised patient  to continue with protected activities  -Recommend continue with no work; if patient has transitioned to tennis shoe may return to work in 2 weeks however if patient is still having pain or swelling and can not tolerate a tennis shoe must remain out of work for another month -Will plan for assessing return to work at next office visit. In the meantime, patient to call office if any issues or problems arise.   Landis Martins, DPM

## 2018-01-22 ENCOUNTER — Encounter: Payer: Self-pay | Admitting: Sports Medicine

## 2018-02-06 ENCOUNTER — Encounter: Payer: Self-pay | Admitting: Sports Medicine

## 2018-02-06 ENCOUNTER — Ambulatory Visit (INDEPENDENT_AMBULATORY_CARE_PROVIDER_SITE_OTHER): Payer: BLUE CROSS/BLUE SHIELD | Admitting: Sports Medicine

## 2018-02-06 DIAGNOSIS — Z9889 Other specified postprocedural states: Secondary | ICD-10-CM

## 2018-02-06 DIAGNOSIS — M79671 Pain in right foot: Secondary | ICD-10-CM

## 2018-02-06 DIAGNOSIS — M7731 Calcaneal spur, right foot: Secondary | ICD-10-CM

## 2018-02-06 MED ORDER — OXYCODONE-ACETAMINOPHEN 5-325 MG PO TABS: 1.0000 | ORAL_TABLET | Freq: Three times a day (TID) | ORAL | 0 refills | Status: DC | PRN

## 2018-02-06 NOTE — Progress Notes (Signed)
Subjective: Amy Douglas is a 47 y.o. female patient seen today in office for POV #6 (DOS 11-20-17, S/P R EPF with Peroneal tendon repair. Patient states that she is doing good however does have pain 6/10 and is back at work walking 14k steps and 10 hours shifts, No other issues noted.   Patient Active Problem List   Diagnosis Date Noted  . HTN, goal below 140/90 08/07/2017  . Low HDL (under 40) 08/07/2017  . Migraine without status migrainosus, not intractable 03/28/2017  . History of migraine headaches 03/28/2017  . Seasonal allergic rhinitis due to pollen 03/28/2017  . Need for immunization against influenza 03/28/2017  . Anxiety 03/28/2017  . Port-A-Cath in place 04/09/2015  . Carcinoma of right breast ([place]) 03/31/2015  . Chronic pain syndrome 03/31/2015  . Status post right mastectomy 03/31/2015    Current Outpatient Medications on File Prior to Visit  Medication Sig Dispense Refill  . cyclobenzaprine (FLEXERIL) 5 MG tablet Take 1 tablet (5 mg total) by mouth 3 (three) times daily as needed for muscle spasms. 30 tablet 1  . hydrochlorothiazide (HYDRODIURIL) 25 MG tablet Take 1 tablet (25 mg total) by mouth daily. 90 tablet 3  . meloxicam (MOBIC) 7.5 MG tablet TAKE 1 TABLET BY MOUTH ONCE DAILY 30 tablet 0  . omeprazole (PRILOSEC) 20 MG capsule Take 20 mg by mouth daily.    . promethazine (PHENERGAN) 25 MG tablet Take 25 mg by mouth every 8 (eight) hours as needed. for nausea  0  . propranolol ER (INDERAL LA) 60 MG 24 hr capsule Take 1 capsule (60 mg total) by mouth daily. 90 capsule 0  . sertraline (ZOLOFT) 100 MG tablet Take 1 tablet (100 mg total) by mouth daily. 90 tablet 1   No current facility-administered medications on file prior to visit.     Allergies  Allergen Reactions  . Hydroxyzine Pamoate Itching  . Amoxicillin-Pot Clavulanate Rash  . Latex Other (See Comments)    Rash and skin blistering  . Other Other (See Comments)    States she has a bad reaction to all  steroids: fever, insomnia, ams  . Diclofenac     Upsets stomach  . Prednisone     AMS    Objective: There were no vitals filed for this visit.  General: No acute distress, AAOx3  Right foot: Incisions well healed at surgical sites, mild swelling to right heel and lateral ankle, no erythema, no warmth, no drainage, no signs of infection noted, Capillary fill time <3 seconds in all digits, gross sensation present via light touch to right foot. + subjective numbness at lateral ankle on right. No pain or crepitation with range of motion right foot.  No pain with calf compression.   Assessment and Plan:  Problem List Items Addressed This Visit    None    Visit Diagnoses    S/P foot surgery, right    -  Primary   Relevant Medications   oxyCODONE-acetaminophen (PERCOCET) 5-325 MG tablet   Heel spur, right       Relevant Medications   oxyCODONE-acetaminophen (PERCOCET) 5-325 MG tablet   Right foot pain       Relevant Medications   oxyCODONE-acetaminophen (PERCOCET) 5-325 MG tablet      -Patient seen and evaluated -Recommend compression sleeve to right foot and ankle for pain and edema control -Recommend continue with work with good supportive shoes -Refilled Percocet for severe pain at bedtime only  -Will plan for final post op check at  next office visit. In the meantime, patient to call office if any issues or problems arise.   Landis Martins, DPM

## 2018-02-12 ENCOUNTER — Telehealth: Payer: Self-pay | Admitting: Family Medicine

## 2018-02-12 NOTE — Telephone Encounter (Signed)
Patient has been feeling dizzy,hot, and hears an ocean sound before she passed out at work. She is requesting to see you asap. [phone]/ [phone] I scheduled her for the next available 02/20/18, does she need to come in sooner.

## 2018-02-13 ENCOUNTER — Other Ambulatory Visit: Payer: Self-pay

## 2018-02-13 ENCOUNTER — Encounter: Payer: Self-pay | Admitting: Family Medicine

## 2018-02-13 ENCOUNTER — Ambulatory Visit (INDEPENDENT_AMBULATORY_CARE_PROVIDER_SITE_OTHER): Payer: BLUE CROSS/BLUE SHIELD | Admitting: Family Medicine

## 2018-02-13 VITALS — BP 120/80 | HR 74 | Temp 98.7°F | Resp 12 | Ht 68.0 in | Wt 213.1 lb

## 2018-02-13 DIAGNOSIS — R55 Syncope and collapse: Secondary | ICD-10-CM | POA: Diagnosis not present

## 2018-02-13 NOTE — Telephone Encounter (Signed)
Spoke with patient and she has an appt today at 10:40am.

## 2018-02-13 NOTE — Patient Instructions (Addendum)
Stop the HCTZ.  Do not skip meals. Drink fluids. No pain medications. Get labs. Get head ct Follow up with cardiology/neurology.

## 2018-02-13 NOTE — Progress Notes (Signed)
Patient ID: Amy Douglas, female    DOB: 10-23-1970, 47 y.o.   MRN: [id number]  Chief Complaint  Patient presents with  . passing out    Allergies Hydroxyzine pamoate; Amoxicillin-pot clavulanate; Latex; Other; Diclofenac; and Prednisone  Subjective:   Amy Douglas is a 47 y.o. female who presents to [facility] today.  HPI Adana presents today for visit after being encouraged by her family and her coworkers to seek medical attention.  She reports that 3 nights ago, she was at work at [employer].  She reports that she was standing at the receiving area and started to feel a little bit funny and heard some whoosing in ears and felt a feeling of dj vu all went black and does not remember anything else. When "came to"was drenched in sweat and hot.  Was also incontinent of urine at that time.  She bit tongue when she fell this last time. Head did not hit the floor.  She was reportedly told by her coworkers that they were able to grab her head so it did not hit the floor.  She does not report any jerking or abnormal behaviors prior to passing out.  She denies any palpitations or chest pain preceding the incident.  Is unsure how long she had loss of consciousness.   She does report that this happened several weeks after she had had foot surgery.  She had foot surgery on 11/20/17 and 3 weeks later passed out when got to the bottom of stairs. Husband witnessed this episode and reports that she did not jerk but got stiff as a board and had urinary incontinence with the event.  Husband reports that she was out for approximately 1 minute.  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  Subsequently started on started on celexa and it helped to keep these events from happening. She denies any numbness or tingling in her extremities.  She denies any fever, night sweats, weight loss, cough.  She  denies any overt seizures.  She feels like she is mentating and thinking clearly.  She does report to me that she is concerned that she could have a tumor or something wrong in her head related to her previous history of cancer.  She denies any headaches.  Her vision is within normal limits.  She has not followed up with oncology.  She is reportedly due to get her mammogram in the next month or so.  She has not felt any lumps in her breasts.  She reports that she has not taken her HCTZ today.  She reports with the 2 recent syncopal episodes that she had taken 1 of her hydrocodone's for pain related to her foot.  She is not sure but she might have skipped a meal or not had as much fluid as she should have with the last passing out episode at [facility].  She usually does not skip meals.  She has been taking all her medications as directed.  She has not missed any doses of her Zoloft.   Loss of Consciousness  This is a recurrent problem. The current episode started more than 1 month ago. The problem occurs intermittently. The problem has been unchanged. She lost consciousness for a period of 1 to 5 minutes. The symptoms are aggravated by unknown factors. Associated symptoms include an auditory change, an aura and bladder incontinence. Pertinent negatives include no  abdominal pain, back pain, chest pain, clumsiness, confusion, diaphoresis, dizziness, fever, focal sensory loss, focal weakness, headaches, light-headedness, malaise/fatigue, nausea, palpitations, slurred speech, vertigo, visual change, vomiting or weakness. She has tried nothing for the symptoms. Her past medical history is significant for HTN. There is no history of arrhythmia, CAD, a clotting disorder, CVA, DM, seizures or TIA.    Past Medical History:  Diagnosis Date  . Breast cancer [facility])     Past Surgical History:  Procedure Laterality Date  . MASTECTOMY    . maxillofacial Bilateral     Family History  Problem Relation Age of Onset    . Hypertension Mother   . Diabetes Father   . Dementia Father   . Heart disease Father      Social History   Socioeconomic History  . Marital status: Married    Spouse name: Not on file  . Number of children: Not on file  . Years of education: Not on file  . Highest education level: Not on file  Occupational History  . Not on file  Social Needs  . Financial resource strain: Not on file  . Food insecurity:    Worry: Not on file    Inability: Not on file  . Transportation needs:    Medical: Not on file    Non-medical: Not on file  Tobacco Use  . Smoking status: Never Smoker  . Smokeless tobacco: Never Used  Substance and Sexual Activity  . Alcohol use: No  . Drug use: No  . Sexual activity: Yes  Lifestyle  . Physical activity:    Days per week: Not on file    Minutes per session: Not on file  . Stress: Not on file  Relationships  . Social connections:    Talks on phone: Not on file    Gets together: Not on file    Attends religious service: Not on file    Active member of club or organization: Not on file    Attends meetings of clubs or organizations: Not on file    Relationship status: Not on file  Other Topics Concern  . Not on file  Social History Narrative  . Not on file   Current Outpatient Medications on File Prior to Visit  Medication Sig Dispense Refill  . oxyCODONE-acetaminophen (PERCOCET) 5-325 MG tablet Take 1 tablet by mouth every 8 (eight) hours as needed for severe pain. 20 tablet 0  . promethazine (PHENERGAN) 25 MG tablet Take 25 mg by mouth every 8 (eight) hours as needed. for nausea  0  . propranolol ER (INDERAL LA) 60 MG 24 hr capsule Take 1 capsule (60 mg total) by mouth daily. 90 capsule 0  . sertraline (ZOLOFT) 100 MG tablet Take 1 tablet (100 mg total) by mouth daily. 90 tablet 1   No current facility-administered medications on file prior to visit.     Review of Systems  Constitutional: Negative for activity change, appetite change,  diaphoresis, fatigue, fever, malaise/fatigue and unexpected weight change.  HENT: Negative for ear pain, trouble swallowing and voice change.   Eyes: Negative for visual disturbance.  Respiratory: Negative for cough, chest tightness and shortness of breath.   Cardiovascular: Positive for syncope. Negative for chest pain, palpitations and leg swelling.  Gastrointestinal: Negative for abdominal pain, nausea and vomiting.  Genitourinary: Positive for bladder incontinence. Negative for difficulty urinating, dysuria, frequency, menstrual problem and urgency.  Musculoskeletal: Negative for back pain, neck pain and neck stiffness.  Skin: Negative for rash.  Neurological: Positive for syncope. Negative for dizziness, vertigo, tremors, focal weakness, facial asymmetry, weakness, light-headedness, numbness and headaches.  Hematological: Negative for adenopathy. Does not bruise/bleed easily.  Psychiatric/Behavioral: Negative for agitation, behavioral problems, confusion, decreased concentration, dysphoric mood, sleep disturbance and suicidal ideas.     Objective:   BP 120/80 (BP Location: Left Arm, Patient Position: Sitting, Cuff Size: Large)   Pulse 74   Temp 98.7 F (37.1 C) (Temporal)   Resp 12   Ht 5\' 8"  (1.727 m)   Wt 213 lb 1.9 oz (96.7 kg)   SpO2 98% Comment: room air  BMI 32.40 kg/m   Physical Exam  Constitutional: She is oriented to person, place, and time. She appears well-developed and well-nourished. No distress.  HENT:  Head: Normocephalic and atraumatic.  Mouth/Throat: Oropharynx is clear and moist.  Eyes: Pupils are equal, round, and reactive to light. Conjunctivae are normal. No scleral icterus.  Neck: Normal range of motion. Neck supple. No JVD present. No thyromegaly present.  Cardiovascular: Normal rate, regular rhythm, normal heart sounds and intact distal pulses.  Pulmonary/Chest: Effort normal and breath sounds normal. No respiratory distress. She has no wheezes.    Abdominal: Soft. Bowel sounds are normal.  Neurological: She is alert and oriented to person, place, and time. She displays normal reflexes. No cranial nerve deficit or sensory deficit. She exhibits normal muscle tone. Coordination normal.  Skin: Skin is warm and dry. Capillary refill takes less than 2 seconds. No rash noted.  Psychiatric: She has a normal mood and affect. Her behavior is normal. Judgment and thought content normal.  Nursing note and vitals reviewed. EKG performed in the office today which revealed normal sinus rhythm/normal EKG.  Ventricular rate of 67.  Normal rate, rhythm, axis and intervals.  Orthostatics performed  lying down- blood pressure 124/72, pulse 72 Sitting up -blood pressure 118/88, pulse 76 Standing -blood pressure 108/68,  Pulse 78  Assessment and Plan  1. Syncope, unspecified syncope type Patient with past history of syncope, but none in greater than 15 years.  Presenting with 2 syncopal episodes with associated urinary incontinence.   Uncertain etiology of syncopal episodes.  Recommend no subsequent pain medication.  Maintain good hydration.  Do not skip meals. Discontinue HCTZ. Check electrolytes. Refer to cardiology to evaluate for dysrhythmia.  Refer to neurology to evaluate for seizure activity/other etiology. Will obtain CT scan of the head at this time. - Ambulatory referral to Cardiology - Ambulatory referral to Neurology - [place]; Future - COMPLETE METABOLIC PANEL WITH GFR - CBC with Differential/Platelet - EKG 12-Lead Patient was counseled regarding worrisome signs and symptoms if this subsequently occur she will go to the emergency department.  She will call with any questions or concerns.  She will follow-up within 1 month.  She will get her mammogram and will consider follow-up with hematology/oncology.  We will perform clinical breast exam at next visit. Return in about 4 weeks (around 03/13/2018) for follow up. [name], MD 02/13/2018

## 2018-02-13 NOTE — Telephone Encounter (Signed)
Please call and advise patient that if she is passed out or if she is having symptoms she needs to go to the urgent care or the emergency department to be evaluated.  We do not have any appointments for today.  You can see if we have any appointments for tomorrow.

## 2018-02-14 LAB — CBC WITH DIFFERENTIAL/PLATELET
BASOS PCT: 0.8 %
Basophils Absolute: 40 cells/uL (ref 0–200)
EOS PCT: 1.6 %
Eosinophils Absolute: 80 cells/uL (ref 15–500)
HEMATOCRIT: 41 % (ref 35.0–45.0)
Hemoglobin: 13.8 g/dL (ref 11.7–15.5)
LYMPHS ABS: 1085 {cells}/uL (ref 850–3900)
MCH: 30.5 pg (ref 27.0–33.0)
MCHC: 33.7 g/dL (ref 32.0–36.0)
MCV: 90.5 fL (ref 80.0–100.0)
MPV: 10 fL (ref 7.5–12.5)
Monocytes Relative: 6.8 %
NEUTROS PCT: 69.1 %
Neutro Abs: 3455 cells/uL (ref 1500–7800)
PLATELETS: 210 10*3/uL (ref 140–400)
RBC: 4.53 10*6/uL (ref 3.80–5.10)
RDW: 13.8 % (ref 11.0–15.0)
Total Lymphocyte: 21.7 %
WBC mixed population: 340 cells/uL (ref 200–950)
WBC: 5 10*3/uL (ref 3.8–10.8)

## 2018-02-14 LAB — COMPLETE METABOLIC PANEL WITH GFR
AG Ratio: 0.9 (calc) — ABNORMAL LOW (ref 1.0–2.5)
ALKALINE PHOSPHATASE (APISO): 61 U/L (ref 33–115)
ALT: 16 U/L (ref 6–29)
AST: 17 U/L (ref 10–35)
Albumin: 4.1 g/dL (ref 3.6–5.1)
BUN: 13 mg/dL (ref 7–25)
CO2: 26 mmol/L (ref 20–32)
CREATININE: 0.97 mg/dL (ref 0.50–1.10)
Calcium: 9.3 mg/dL (ref 8.6–10.2)
Chloride: 101 mmol/L (ref 98–110)
GFR, Est African American: 82 mL/min/{1.73_m2} (ref >=60)
GFR, Est Non African American: 71 mL/min/{1.73_m2} (ref >=60)
GLUCOSE: 92 mg/dL (ref 65–99)
Globulin: 4.6 g/dL (calc) — ABNORMAL HIGH (ref 1.9–3.7)
Potassium: 3.8 mmol/L (ref 3.5–5.3)
SODIUM: 134 mmol/L — AB (ref 135–146)
Total Bilirubin: 0.5 mg/dL (ref 0.2–1.2)
Total Protein: 8.7 g/dL — ABNORMAL HIGH (ref 6.1–8.1)

## 2018-02-15 ENCOUNTER — Ambulatory Visit (INDEPENDENT_AMBULATORY_CARE_PROVIDER_SITE_OTHER): Payer: BLUE CROSS/BLUE SHIELD | Admitting: Neurology

## 2018-02-15 ENCOUNTER — Encounter: Payer: Self-pay | Admitting: Family Medicine

## 2018-02-15 ENCOUNTER — Encounter: Payer: Self-pay | Admitting: Neurology

## 2018-02-15 ENCOUNTER — Telehealth: Payer: Self-pay | Admitting: Neurology

## 2018-02-15 VITALS — BP 109/71 | HR 73 | Ht 68.0 in | Wt 217.0 lb

## 2018-02-15 DIAGNOSIS — Q378 Unspecified cleft palate with bilateral cleft lip: Secondary | ICD-10-CM | POA: Diagnosis not present

## 2018-02-15 DIAGNOSIS — G40209 Localization-related (focal) (partial) symptomatic epilepsy and epileptic syndromes with complex partial seizures, not intractable, without status epilepticus: Secondary | ICD-10-CM

## 2018-02-15 DIAGNOSIS — Z853 Personal history of malignant neoplasm of breast: Secondary | ICD-10-CM

## 2018-02-15 DIAGNOSIS — G40109 Localization-related (focal) (partial) symptomatic epilepsy and epileptic syndromes with simple partial seizures, not intractable, without status epilepticus: Secondary | ICD-10-CM

## 2018-02-15 MED ORDER — LAMOTRIGINE 25 MG PO TABS: ORAL_TABLET | ORAL | 1 refills | Status: DC

## 2018-02-15 NOTE — Patient Instructions (Signed)
Lamotrigine tablets What is this medicine? LAMOTRIGINE (la MOE tri jeen) is used to control seizures in adults and children with epilepsy and Lennox-Gastaut syndrome. It is also used in adults to treat bipolar disorder. This medicine may be used for other purposes; ask your health care provider or pharmacist if you have questions. COMMON BRAND NAME(S): Lamictal What should I tell my health care provider before I take this medicine? They need to know if you have any of these conditions: -a history of depression or bipolar disorder -aseptic meningitis during prior use of lamotrigine -folate deficiency -kidney disease -liver disease -suicidal thoughts, plans, or attempt; a previous suicide attempt by you or a family member -an unusual or allergic reaction to lamotrigine or other seizure medications, other medicines, foods, dyes, or preservatives -pregnant or trying to get pregnant -breast-feeding How should I use this medicine? Take this medicine by mouth with a glass of water. Follow the directions on the prescription label. Do not chew these tablets. If this medicine upsets your stomach, take it with food or milk. Take your doses at regular intervals. Do not take your medicine more often than directed. A special MedGuide will be given to you by the pharmacist with each new prescription and refill. Be sure to read this information carefully each time. Talk to your pediatrician regarding the use of this medicine in children. While this drug may be prescribed for children as young as 2 years for selected conditions, precautions do apply. Overdosage: If you think you have taken too much of this medicine contact a poison control center or emergency room at once. NOTE: This medicine is only for you. Do not share this medicine with others. What if I miss a dose? If you miss a dose, take it as soon as you can. If it is almost time for your next dose, take only that dose. Do not take double or extra  doses. What may interact with this medicine? -carbamazepine -female hormones, including contraceptive or birth control pills -methotrexate -phenobarbital -phenytoin -primidone -pyrimethamine -rifampin -trimethoprim -valproic acid This list may not describe all possible interactions. Give your health care provider a list of all the medicines, herbs, non-prescription drugs, or dietary supplements you use. Also tell them if you smoke, drink alcohol, or use illegal drugs. Some items may interact with your medicine. What should I watch for while using this medicine? Visit your doctor or health care professional for regular checks on your progress. If you take this medicine for seizures, wear a Medic Alert bracelet or necklace. Carry an identification card with information about your condition, medicines, and doctor or health care professional. It is important to take this medicine exactly as directed. When first starting treatment, your dose will need to be adjusted slowly. It may take weeks or months before your dose is stable. You should contact your doctor or health care professional if your seizures get worse or if you have any new types of seizures. Do not stop taking this medicine unless instructed by your doctor or health care professional. Stopping your medicine suddenly can increase your seizures or their severity. Contact your doctor or health care professional right away if you develop a rash while taking this medicine. Rashes may be very severe and sometimes require treatment in the hospital. Deaths from rashes have occurred. Serious rashes occur more often in children than adults taking this medicine. It is more common for these serious rashes to occur during the first 2 months of treatment, but a rash can   occur at any time. You may get drowsy, dizzy, or have blurred vision. Do not drive, use machinery, or do anything that needs mental alertness until you know how this medicine affects you.  To reduce dizzy or fainting spells, do not sit or stand up quickly, especially if you are an older patient. Alcohol can increase drowsiness and dizziness. Avoid alcoholic drinks. If you are taking this medicine for bipolar disorder, it is important to report any changes in your mood to your doctor or health care professional. If your condition gets worse, you get mentally depressed, feel very hyperactive or manic, have difficulty sleeping, or have thoughts of hurting yourself or committing suicide, you need to get help from your health care professional right away. If you are a caregiver for someone taking this medicine for bipolar disorder, you should also report these behavioral changes right away. The use of this medicine may increase the chance of suicidal thoughts or actions. Pay special attention to how you are responding while on this medicine. Your mouth may get dry. Chewing sugarless gum or sucking hard candy, and drinking plenty of water may help. Contact your doctor if the problem does not go away or is severe. Women who become pregnant while using this medicine may enroll in the [place] American Antiepileptic Drug Pregnancy Registry by calling 1-888-233-2334. This registry collects information about the safety of antiepileptic drug use during pregnancy. What side effects may I notice from receiving this medicine? Side effects that you should report to your doctor or health care professional as soon as possible: -allergic reactions like skin rash, itching or hives, swelling of the face, lips, or tongue -blurred or double vision -difficulty walking or controlling muscle movements -fever -headache, stiff neck, and sensitivity to light -painful sores in the mouth, eyes, or nose -redness, blistering, peeling or loosening of the skin, including inside the mouth -severe muscle pain -swollen lymph glands -uncontrollable eye movements -unusual bruising or bleeding -unusually weak or  tired -vomiting -worsening of mood, thoughts or actions of suicide or dying -yellowing of the eyes or skin Side effects that usually do not require medical attention (report to your doctor or health care professional if they continue or are bothersome): -diarrhea or constipation -difficulty sleeping -nausea -tremors This list may not describe all possible side effects. Call your doctor for medical advice about side effects. You may report side effects to FDA at 1-800-FDA-1088. Where should I keep my medicine? Keep out of reach of children. Store at room temperature between 15 and 30 degrees C (59 and 86 degrees F). Throw away any unused medicine after the expiration date. NOTE: This sheet is a summary. It may not cover all possible information. If you have questions about this medicine, talk to your doctor, pharmacist, or health care provider.  2018 Elsevier/Gold Standard (2015-07-23 09:29:40)  

## 2018-02-15 NOTE — Progress Notes (Addendum)
Provider:  Larey Seat, M D  Referring Provider: [name], MD Primary Care Physician:  [name], MD  Chief Complaint  Patient presents with  . New Patient (Initial Visit)    Pt here with husband, rm 68.  pt states that she saw her PCP on Tuesday and they wanted her to be seen urgent.  Last sat night at work she passed out and hit the floor, but her employe was able to cradle her head- and associated with urine incontinence.  Her eyes were rolling back, open eyes, lids flutter and she convulsed- hands fisted .  These episodes started when she was 47 year old and were regular , almost weekly for a while. Her father had epilepsy and nobody thought about seeking med advice. She was not treated or officially diagnosed.   She was undergoing a lot of facial surgery until age 82 . She had a spell during pregnancy/ delivery  was diagnosed with cardio neuro- syncope-  And was started on Celexa ,went a while without spells.  Until [age] wks age post recent foot surgery. She had a spell and then another one on Sat night. these are the only 2 instances in 17 years.   . Other    Amy Douglas states that prior to the event she feels a whooshing sound ( like ocean waves ) and  has a aura with nausea associated and knows to sit down. She did not have the aura before Saturday's episode and couldn't sit down.     HPI:  Amy Douglas is a 47 y.o. female  Is seen here as a referral urgent visit request  from Dr. Mannie Stabile for spells.  Mrs. Dugo , here with her husband , states that she saw her PCP on Tuesday and they wanted her to be seen urgent.  Last sat night at work she passed out and hit the floor, but her employe was able to cradle her head- and associated with urine incontinence.  Her eyes were rolling back, open eyes, lids flutter and she convulsed- hands fisted .  These episodes started when she was 47 year old and were regular , almost weekly for a while. Amy Douglas states that  prior to the event she feels a whooshing sound ( like ocean waves ) and  has a aura with nausea associated and knows to sit down. She did not have the aura before Saturday's episode and couldn't sit down.    Her father had epilepsy and nobody thought about seeking med advice - ' they didn't want me labeled" . She was not treated or officially diagnosed.   She was undergoing a lot of facial surgery until age 63 .  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  Until [age] wks age post recent foot surgery. She had a spell and then another one on Sat night. these are the only 2 instances in 17 years.   15 February 2018, chief complaint is described above.  The patient gave some more details during her visit with Dr. Marvis Repress just last Tuesday.  3 nights ago she was at work at the [employer] she was standing in the receiving area and started to feel a little bit lightheaded or funny.  She heard again the washing ocean wave sound and a dj vu.  Everything went black and after that she cannot remember anything  else.  When she woke up again she was drenched in sweat and felt very hot she had urinary incontinence, she had bit her tongue.  Her head did not hit the floor as described above a coworker was able to cradle her, she had no jerking movements or involuntary movements before she lost awareness, at this time she had such as short aura that she could not protect herself from the fall.  She also recalls a second spell that happened just after she had foot surgery late May 2019 she was prescribed pain medication but did not think that she happened to take her birthday.  She passed out when she got to the bottom of her home stairs, her husband saw her and he stated that she got stiff and trembling her arms flexed and her hands fisted and she had also urinary incontinence with the event, her eyelids were  fluttering.  She was out for approximately a minutes.  These spells happened while the patient was treated with Zoloft 100 mg which should have prevented her cardio- vagal syncope   The patient has a bilaterally cleft lip and palate,  Mucous pit in lower lip, and residual opening in the bony palate. She had bone grafted to the palate at age ( 2002) , 57.   in 2016-17 she was diagnosed with breast cancer and underwent right mastectomy, followed by chemotherapy . She had multiple orthopedic procedures. Anterior cervical disc and fusion-  3/ 2017 ( Dr. Carloyn Manner). She was diagnosed with OSA and uses a CPAP while using a full upper dental plate - this keeps her patent palatal cleft closed.     Social history -was 46 years old at birth of her son, married later in 2003. Smoker , quit 12/ 2002. ETOH - rare, caffeine :  2 pepsis and sweet iced tea, half gallon a day -     Review of Systems: Out of a complete 14 system review, the patient complains of only the following symptoms, and all other reviewed systems are negative.  spells.   Social History   Socioeconomic History  . Marital status: Married    Spouse name: Not on file  . Number of children: Not on file  . Years of education: Not on file  . Highest education level: Not on file  Occupational History  . Not on file  Social Needs  . Financial resource strain: Not on file  . Food insecurity:    Worry: Not on file    Inability: Not on file  . Transportation needs:    Medical: Not on file    Non-medical: Not on file  Tobacco Use  . Smoking status: Never Smoker  . Smokeless tobacco: Never Used  Substance and Sexual Activity  . Alcohol use: No  . Drug use: No  . Sexual activity: Yes  Lifestyle  . Physical activity:    Days per week: Not on file    Minutes per session: Not on file  . Stress: Not on file  Relationships  . Social connections:    Talks on phone: Not on file    Gets together: Not on file    Attends religious service: Not  on file    Active member of club or organization: Not on file    Attends meetings of clubs or organizations: Not on file    Relationship status: Not on file  . Intimate partner violence:    Fear of current or ex partner: Not on file  Emotionally abused: Not on file    Physically abused: Not on file    Forced sexual activity: Not on file  Other Topics Concern  . Not on file  Social History Narrative  . Not on file    Family History  Problem Relation Age of Onset  . Hypertension Mother   . Diabetes Father   . Dementia Father   . Heart disease Father     Past Medical History:  Diagnosis Date  . Breast cancer [facility])     Past Surgical History:  Procedure Laterality Date  . MASTECTOMY    . maxillofacial Bilateral     Current Outpatient Medications  Medication Sig Dispense Refill  . oxyCODONE-acetaminophen (PERCOCET) 5-325 MG tablet Take 1 tablet by mouth every 8 (eight) hours as needed for severe pain. 20 tablet 0  . promethazine (PHENERGAN) 25 MG tablet Take 25 mg by mouth every 8 (eight) hours as needed. for nausea  0  . propranolol ER (INDERAL LA) 60 MG 24 hr capsule Take 1 capsule (60 mg total) by mouth daily. 90 capsule 0  . sertraline (ZOLOFT) 100 MG tablet Take 1 tablet (100 mg total) by mouth daily. 90 tablet 1   No current facility-administered medications for this visit.    EKG last week nl, but abnormal with orthostatic BP- PCP recorded.   Allergies as of 02/15/2018 - Review Complete 02/15/2018  Allergen Reaction Noted  . Hydroxyzine pamoate Itching 03/30/2015  . Amoxicillin-pot clavulanate Rash 03/30/2015  . Latex Other (See Comments) 04/30/2015  . Other Other (See Comments) 03/31/2015  . Diclofenac  10/17/2017  . Prednisone  10/17/2017    Vitals: BP 109/71   Pulse 73   Ht 5\' 8"  (1.727 m)   Wt 217 lb (98.4 kg)   BMI 32.99 kg/m     Last Weight:  Wt Readings from Last 1 Encounters:  02/15/18 217 lb (98.4 kg)   Last Height:   Ht Readings from  Last 1 Encounters:  02/15/18 5\' 8"  (1.727 m)     Physical exam:  General: The patient is awake, alert and appears not in acute distress. The patient is well groomed. Head: Normocephalic, atraumatic. Neck is supple. Mallampati 3- patent Cleft- ,  neck circumference: 16. 25 '  Cardiovascular:  Regular rate and rhythm , without  murmurs or carotid bruit, and without distended neck veins. Respiratory: Lungs are clear to auscultation. Skin:  Without evidence of edema, or rash Trunk: BMI is  elevated and patient  has normal posture.  Neurologic exam : The patient is awake and alert, oriented to place and time.  Memory subjective  described as intact. There is a normal attention span & concentration ability. Speech is fluent with dysarthria, mild dysphonia not  aphasia. Mood and affect are appropriate.  Cranial nerves: Pupils are equal and briskly reactive to light. Funduscopic exam without  evidence of pallor or edema. Extraocular movements  in vertical and horizontal planes intact and without nystagmus.  Visual fields by finger perimetry are intact. Hearing to finger rub intact.  Facial sensation intact to fine touch.  Facial motor strength is symmetric- moves midline.  Tongue protrusion into either cheek is normal. Shoulder shrug is normal.   Motor exam:   Normal tone ,muscle bulk and symmetric strength in all extremities. Sensory:  Fine touch, pinprick and vibration were tested in all extremities. Proprioception was normal. Coordination: Rapid alternating movements / Finger-to-nose maneuver  normal without evidence of ataxia, dysmetria or tremor. Gait and station: Patient  walks without assistive device .  Deep tendon reflexes: in the  upper and lower extremities are symmetric and intact. Babinski maneuver response is downgoing.   Assessment:  After physical and neurologic examination, review of laboratory studies, imaging, neurophysiology testing and pre-existing records, assessment is  that of :    I strongly suspect a seizure event by description, rather than a syncope.  I appreciate that Dr. Marvis Repress documented the orthostatic blood pressure and heart rate, in lying position supine blood pressure was 124/72 mmHg with a pulse rate of 72/min, while seated blood pressure was 118/88 mmHg, pulse rate 76/min and standing up blood pressure became 108/68 mmHg, pulse rate 78/min.    EKG performed at the office revealed normal sinus rhythm ventricular rate of 67 bpm normal axis normal intervals.  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     Plan:  Treatment plan and additional workup :  EEG , 3 serial EEGs unless one is abnormal- over 30 minutes each, ambulatory in office.    Brain MRI-  With and without -  Need to rule out breast cancer mets, rule out brain malformation in dysraphia-  syndrome.  cancel CT head   Start medication for seizures-  Lamictal titration. Wrote for 25 mg tabs.  Avoid pain medication such as Ultram / tramadol and depression medication such as Wellbutrin/ Chantix . Hand out for  epilepsy ,seizures given.   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  6 month of seizure freedom would allow for driving to resume, MRI and EEGs need to be normal.    Larey Seat MD 02/15/2018

## 2018-02-15 NOTE — Telephone Encounter (Signed)
[employer] Auth: [id number] (exp. 02/15/18 to 04/15/18) order sent to GI. They will reach out to the pt to schedule.

## 2018-02-20 ENCOUNTER — Ambulatory Visit: Payer: BLUE CROSS/BLUE SHIELD | Admitting: Family Medicine

## 2018-02-22 ENCOUNTER — Ambulatory Visit (INDEPENDENT_AMBULATORY_CARE_PROVIDER_SITE_OTHER): Payer: BLUE CROSS/BLUE SHIELD | Admitting: Neurology

## 2018-02-22 DIAGNOSIS — G40109 Localization-related (focal) (partial) symptomatic epilepsy and epileptic syndromes with simple partial seizures, not intractable, without status epilepticus: Secondary | ICD-10-CM

## 2018-02-22 DIAGNOSIS — Q378 Unspecified cleft palate with bilateral cleft lip: Secondary | ICD-10-CM

## 2018-02-22 DIAGNOSIS — Z853 Personal history of malignant neoplasm of breast: Secondary | ICD-10-CM

## 2018-02-22 DIAGNOSIS — G40209 Localization-related (focal) (partial) symptomatic epilepsy and epileptic syndromes with complex partial seizures, not intractable, without status epilepticus: Secondary | ICD-10-CM | POA: Diagnosis not present

## 2018-02-26 NOTE — Procedures (Signed)
EEG REPORT  This is a report for the patient Amy Douglas, medical record #[id number], date of birth 06-Jan-1971.    This EEG was performed on 22 February 2018 with a running time of 33 minutes and 19 seconds.  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  She has multiple times passed out while standing has fallen to the floor, apparently associated with urine incontinence, her eyes were open rolling back, her eyelids fluttered and she convulsed both hands were fisted.  Her father had epilepsy.  This is a routine 21 channel EEG recording with one channel dedicated to an EKG leak.  The electrodes were placed in accordance with the International 10-20 system.  Background EEG consists of a 8 to 9 Hz posterior dominant rhythm with amplitudes of 30 to 40 V.  The background appears symmetric over the bilateral posterior derivations and attenuated promptly with eye-opening.  There was a well-formed anterior to posterior gradient.  Both hemispheres appeared symmetric.  Hyperventilation led to slowing but no epileptiform activity was noted.  Following hyperventilation the symmetric rhythm and amplitude over both hemispheres was quickly restored.   Photic stimulation showed entrainment- beginning at 3 Hz activity and through 6, 9, and up to 18 Hz photic stimulation.  No epileptiform activity is associated with this either.   The associated EKG electrode shows tachycardia heart rate at the beginning of the study is in the mid 70s but during photic stimulation and hyperventilation tachycardia with sinus rhythm 102 bpm is noted.  Conclusion this is a normal awake and drowsy EEG for this patient.  Sleep was not entered.  No epileptiform activity is noted. Please note that a single normal EEG does not rule out the presence of seizures or epileptiform disorders.  A carbon copy of this report will be sent to Dr. Butler Denmark, MD.  Larey Seat, MD

## 2018-02-27 ENCOUNTER — Telehealth: Payer: Self-pay | Admitting: Neurology

## 2018-02-27 NOTE — Telephone Encounter (Signed)
Called the patient and reviewed that Dr Brett Fairy looked over the EEG and states that it was normal. Pt asked if she would need to move forward with the 2nd EEG. Informed her that per Dr Brett Fairy she would like the patient to have the 2nd EEG completed as well as the 3rd EEG unless the 2nd one shows up abnormal. Advised the patient to call and get scheduled for the 2nd EEG. Pt verbalized understanding.

## 2018-02-27 NOTE — Telephone Encounter (Signed)
-----   Message from Larey Seat, MD sent at 02/26/2018  6:02 PM EDT ----- Normal EEG

## 2018-03-01 ENCOUNTER — Inpatient Hospital Stay: Admission: RE | Admit: 2018-03-01 | Payer: BLUE CROSS/BLUE SHIELD | Source: Ambulatory Visit

## 2018-03-02 ENCOUNTER — Encounter (HOSPITAL_COMMUNITY): Payer: Self-pay | Admitting: Emergency Medicine

## 2018-03-02 ENCOUNTER — Emergency Department (HOSPITAL_COMMUNITY)
Admission: EM | Admit: 2018-03-02 | Discharge: 2018-03-02 | Disposition: A | Discharge status: Home / Self Care | Payer: BLUE CROSS/BLUE SHIELD | Attending: Emergency Medicine | Admitting: Emergency Medicine

## 2018-03-02 ENCOUNTER — Emergency Department (HOSPITAL_COMMUNITY): Payer: BLUE CROSS/BLUE SHIELD

## 2018-03-02 ENCOUNTER — Other Ambulatory Visit: Payer: Self-pay

## 2018-03-02 DIAGNOSIS — M791 Myalgia, unspecified site: Secondary | ICD-10-CM | POA: Diagnosis present

## 2018-03-02 DIAGNOSIS — J189 Pneumonia, unspecified organism: Secondary | ICD-10-CM

## 2018-03-02 DIAGNOSIS — Z91040 Latex allergy status: Secondary | ICD-10-CM | POA: Insufficient documentation

## 2018-03-02 DIAGNOSIS — N3000 Acute cystitis without hematuria: Secondary | ICD-10-CM | POA: Insufficient documentation

## 2018-03-02 DIAGNOSIS — Z853 Personal history of malignant neoplasm of breast: Secondary | ICD-10-CM | POA: Insufficient documentation

## 2018-03-02 DIAGNOSIS — Z79899 Other long term (current) drug therapy: Secondary | ICD-10-CM | POA: Insufficient documentation

## 2018-03-02 DIAGNOSIS — J181 Lobar pneumonia, unspecified organism: Secondary | ICD-10-CM | POA: Diagnosis not present

## 2018-03-02 LAB — URINALYSIS, ROUTINE W REFLEX MICROSCOPIC
Bilirubin Urine: NEGATIVE
Glucose, UA: NEGATIVE mg/dL
Ketones, ur: NEGATIVE mg/dL
Leukocytes, UA: NEGATIVE
NITRITE: POSITIVE — AB
PH: 6 (ref 5.0–8.0)
Protein, ur: NEGATIVE mg/dL
SPECIFIC GRAVITY, URINE: 1.006 (ref 1.005–1.030)

## 2018-03-02 LAB — COMPREHENSIVE METABOLIC PANEL
ALBUMIN: 3.3 g/dL — AB (ref 3.5–5.0)
ALK PHOS: 53 U/L (ref 38–126)
ALT: 18 U/L (ref 0–44)
ANION GAP: 5 (ref 5–15)
AST: 24 U/L (ref 15–41)
BUN: 9 mg/dL (ref 6–20)
CALCIUM: 8.2 mg/dL — AB (ref 8.9–10.3)
CO2: 23 mmol/L (ref 22–32)
Chloride: 104 mmol/L (ref 98–111)
Creatinine, Ser: 1.1 mg/dL — ABNORMAL HIGH (ref 0.44–1.00)
GFR calc non Af Amer: 60 mL/min — ABNORMAL LOW (ref >60)
GLUCOSE: 99 mg/dL (ref 70–99)
POTASSIUM: 3.9 mmol/L (ref 3.5–5.1)
Sodium: 132 mmol/L — ABNORMAL LOW (ref 135–145)
TOTAL PROTEIN: 8.3 g/dL — AB (ref 6.5–8.1)
Total Bilirubin: 0.5 mg/dL (ref 0.3–1.2)

## 2018-03-02 LAB — GROUP A STREP BY PCR: Group A Strep by PCR: NOT DETECTED

## 2018-03-02 LAB — CBC WITH DIFFERENTIAL/PLATELET
BASOS ABS: 0 10*3/uL (ref 0.0–0.1)
Basophils Relative: 0 %
Eosinophils Absolute: 0.2 10*3/uL (ref 0.0–0.7)
Eosinophils Relative: 6 %
HEMATOCRIT: 35.2 % — AB (ref 36.0–46.0)
HEMOGLOBIN: 11.8 g/dL — AB (ref 12.0–15.0)
LYMPHS PCT: 20 %
Lymphs Abs: 0.5 10*3/uL — ABNORMAL LOW (ref 0.7–4.0)
MCH: 30.3 pg (ref 26.0–34.0)
MCHC: 33.5 g/dL (ref 30.0–36.0)
MCV: 90.5 fL (ref 78.0–100.0)
Monocytes Absolute: 0.2 10*3/uL (ref 0.1–1.0)
Monocytes Relative: 8 %
NEUTROS ABS: 1.6 10*3/uL — AB (ref 1.7–7.7)
NEUTROS PCT: 66 %
Platelets: 123 10*3/uL — ABNORMAL LOW (ref 150–400)
RBC: 3.89 MIL/uL (ref 3.87–5.11)
RDW: 13.7 % (ref 11.5–15.5)
WBC: 2.5 10*3/uL — AB (ref 4.0–10.5)

## 2018-03-02 LAB — LACTIC ACID, PLASMA: Lactic Acid, Venous: 0.7 mmol/L (ref 0.5–1.9)

## 2018-03-02 MED ORDER — LEVOFLOXACIN 750 MG PO TABS: 750.0000 mg | ORAL_TABLET | Freq: Every day | ORAL | 0 refills | Status: DC

## 2018-03-02 MED ORDER — LEVOFLOXACIN IN D5W 750 MG/150ML IV SOLN
750.0000 mg | Freq: Once | INTRAVENOUS | Status: AC
  Administered 2018-03-02: 750 mg via INTRAVENOUS
  Filled 2018-03-02: qty 150

## 2018-03-02 MED ORDER — SODIUM CHLORIDE 0.9 % IV BOLUS
1000.0000 mL | Freq: Once | INTRAVENOUS | Status: AC
  Administered 2018-03-02: 1000 mL via INTRAVENOUS

## 2018-03-02 MED ORDER — ACETAMINOPHEN 500 MG PO TABS
1000.0000 mg | ORAL_TABLET | Freq: Once | ORAL | Status: AC
  Administered 2018-03-02: 1000 mg via ORAL
  Filled 2018-03-02: qty 2

## 2018-03-02 NOTE — Discharge Instructions (Addendum)
Take your next dose of the antibiotic tomorrow and complete the entire course of medicine.  Rest and make sure you are drinking plenty of fluids.  Take tylenol and/or motrin for fever reduction.  You can safely alternate these medicines taking the opposite one every 3 hours if needed for symptom relief.

## 2018-03-02 NOTE — ED Provider Notes (Signed)
[facility] EMERGENCY DEPARTMENT Provider Note   CSN: [id number] Arrival date & time: 03/02/18  1144     History   Chief Complaint Chief Complaint  Patient presents with  . Generalized Body Aches    HPI Amy Douglas is a 47 y.o. female presenting with a 3-day history of generalized body aches in association with high fever, sore throat and generalized fatigue.  She is also had a nonproductive cough and describes bilateral mid back pain which is worsened when supine.  She denies shortness of breath, chest pain, abdominal pain, nausea, vomiting, diarrhea, dysuria or hematuria.  She has taken Tylenol prior to arrival with no significant improvement in her symptoms.  She states her boss came to work early this week with similar symptoms including fever and URI type symptoms.  She denies any other known exposures, however works at [employer] and is around lots of people.  The history is provided by the patient and the spouse.    Past Medical History:  Diagnosis Date  . Breast cancer [facility])     Patient Active Problem List   Diagnosis Date Noted  . HTN, goal below 140/90 08/07/2017  . Low HDL (under 40) 08/07/2017  . Migraine without status migrainosus, not intractable 03/28/2017  . History of migraine headaches 03/28/2017  . Seasonal allergic rhinitis due to pollen 03/28/2017  . Need for immunization against influenza 03/28/2017  . Anxiety 03/28/2017  . Port-A-Cath in place 04/09/2015  . Carcinoma of right breast ([place]) 03/31/2015  . Chronic pain syndrome 03/31/2015  . Status post right mastectomy 03/31/2015    Past Surgical History:  Procedure Laterality Date  . MASTECTOMY    . maxillofacial Bilateral      OB History   None      Home Medications    Prior to Admission medications   Medication Sig Start Date End Date Taking? Authorizing Provider  lamoTRIgine (LAMICTAL) 25 MG tablet Take 25 mg bid for 1 week, followed by  2 weeks at 50 mg bid, than 75 mg bid po for 2  weeks, than 100 mg bid for 2 weeks and stay there. Patient taking differently: Take 50 mg by mouth 2 (two) times daily. Take 25 mg bid for 1 week, followed by  2 weeks at 50 mg bid, than 75 mg bid po for 2 weeks, than 100 mg bid for 2 weeks and stay there. 02/15/18  Yes Dohmeier, Asencion Partridge, MD  propranolol ER (INDERAL LA) 60 MG 24 hr capsule Take 1 capsule (60 mg total) by mouth daily. 09/27/17  Yes [name], MD  sertraline (ZOLOFT) 100 MG tablet Take 1 tablet (100 mg total) by mouth daily. 03/28/17  Yes [name], MD  levofloxacin (LEVAQUIN) 750 MG tablet Take 1 tablet (750 mg total) by mouth daily. 03/02/18   Evalee Jefferson, PA-C  oxyCODONE-acetaminophen (PERCOCET) 5-325 MG tablet Take 1 tablet by mouth every 8 (eight) hours as needed for severe pain. Patient not taking: Reported on 03/02/2018 02/06/18   Landis Martins, DPM    Family History Family History  Problem Relation Age of Onset  . Hypertension Mother   . Diabetes Father   . Dementia Father   . Heart disease Father     Social History Social History   Tobacco Use  . Smoking status: Never Smoker  . Smokeless tobacco: Never Used  Substance Use Topics  . Alcohol use: No  . Drug use: No     Allergies   Hydroxyzine pamoate; Amoxicillin-pot clavulanate; Latex; Other;  Diclofenac; Prednisone; and Vistaril [hydroxyzine hcl]   Review of Systems Review of Systems  Constitutional: Positive for fatigue and fever.  HENT: Positive for sore throat. Negative for congestion.   Eyes: Negative.   Respiratory: Positive for cough. Negative for chest tightness and shortness of breath.   Cardiovascular: Negative for chest pain.  Gastrointestinal: Negative for abdominal pain, nausea and vomiting.  Genitourinary: Negative.  Negative for dysuria, frequency, urgency and vaginal discharge.  Musculoskeletal: Positive for back pain and myalgias. Negative for arthralgias, joint swelling and neck pain.  Skin: Negative.  Negative for rash and  wound.  Neurological: Negative for dizziness, weakness, light-headedness, numbness and headaches.  Psychiatric/Behavioral: Negative.      Physical Exam Updated Vital Signs BP (!) 108/58 (BP Location: Left Arm)   Pulse 81   Temp 98.4 F (36.9 C) (Oral)   Resp 18   Ht 5\' 8"  (1.727 m)   Wt 98.4 kg   SpO2 100%   BMI 32.99 kg/m   Physical Exam  Constitutional: She appears well-developed and well-nourished.  Appears fatigued  HENT:  Head: Normocephalic and atraumatic.  Eyes: Conjunctivae are normal.  Neck: Normal range of motion. Neck supple.  Cardiovascular: Normal rate, regular rhythm, normal heart sounds and intact distal pulses.  Pulmonary/Chest: Effort normal and breath sounds normal. No stridor. No respiratory distress. She has no wheezes. She has no rales.  Abdominal: Soft. Bowel sounds are normal. She exhibits no mass. There is no tenderness. There is no guarding.  No cva tenderness  Musculoskeletal: Normal range of motion.  Lymphadenopathy:    She has no cervical adenopathy.  Neurological: She is alert.  Skin: Skin is warm and dry.  Psychiatric: She has a normal mood and affect.  Nursing note and vitals reviewed.    ED Treatments / Results  Labs (all labs ordered are listed, but only abnormal results are displayed) Labs Reviewed  URINALYSIS, ROUTINE W REFLEX MICROSCOPIC - Abnormal; Notable for the following components:      Result Value   Hgb urine dipstick MODERATE (*)    Nitrite POSITIVE (*)    Bacteria, UA MANY (*)    All other components within normal limits  COMPREHENSIVE METABOLIC PANEL - Abnormal; Notable for the following components:   Sodium 132 (*)    Creatinine, Ser 1.10 (*)    Calcium 8.2 (*)    Total Protein 8.3 (*)    Albumin 3.3 (*)    GFR calc non Af Amer 60 (*)    All other components within normal limits  CBC WITH DIFFERENTIAL/PLATELET - Abnormal; Notable for the following components:   WBC 2.5 (*)    Hemoglobin 11.8 (*)    HCT 35.2  (*)    Platelets 123 (*)    Neutro Abs 1.6 (*)    Lymphs Abs 0.5 (*)    All other components within normal limits  GROUP A STREP BY PCR  CULTURE, BLOOD (ROUTINE X 2)  CULTURE, BLOOD (ROUTINE X 2)  URINE CULTURE  LACTIC ACID, PLASMA    EKG None  Radiology Dg Chest 2 View  Result Date: 03/02/2018 CLINICAL DATA:  Fever and body aches for the past 2 days. EXAM: CHEST - 2 VIEW COMPARISON:  None. FINDINGS: The heart size and mediastinal contours are within normal limits. Normal pulmonary vascularity. Subtle increased patchy density in the retrocardiac left lower lobe. No pleural effusion or pneumothorax. Lower cervical ACDF. No acute osseous abnormality. IMPRESSION: Mild patchy density in the retrocardiac left lower lobe, suspicious  for pneumonia. Electronically Signed   By: Titus Dubin M.D.   On: 03/02/2018 12:59    Procedures Procedures (including critical care time)  Medications Ordered in ED Medications  acetaminophen (TYLENOL) tablet 1,000 mg (1,000 mg Oral Given 03/02/18 1217)  sodium chloride 0.9 % bolus 1,000 mL (0 mLs Intravenous Stopped 03/02/18 1527)  levofloxacin (LEVAQUIN) IVPB 750 mg (0 mg Intravenous Stopped 03/02/18 1555)  sodium chloride 0.9 % bolus 1,000 mL (0 mLs Intravenous Stopped 03/02/18 1528)  sodium chloride 0.9 % bolus 1,000 mL (0 mLs Intravenous Stopped 03/02/18 1555)     Initial Impression / Assessment and Plan / ED Course  I have reviewed the triage vital signs and the nursing notes.  Pertinent labs & imaging results that were available during my care of the patient were reviewed by me and considered in my medical decision making (see chart for details).     Upon initial presentation, patient had a high fever of 103.1, she otherwise had normal vital signs.  She was given Tylenol to help with fever reduction.  After this, recheck of her vitals revealed hypotension, although she denied having any worsening symptoms or weakness or dizziness and had been  ambulatory in the department.  Code sepsis was called at this time with additional labs obtained, IV fluids was also given.  Her lactate was normal range therefore she was not septic.  After the IV fluids she felt much improved.  Temperature improved with Tylenol given.  X-ray suggesting pneumonia at her left lower lobe which may also explain her back pain.  However she also was nitrite positive and had bacteria in her urine.  She had no CVA tenderness on reexam.  Since she had no nausea or vomiting, she was felt stable to be treated as an outpatient.  She was given a dose of Levaquin here, prescription for further dosing provided.  Patient with asymptomatic UTI, given back pain it is unclear if this is secondary to the pneumonia seen on her x-ray or if this could be pyelonephritis.  She was given strict return precautions.  Discussed alternating Tylenol and Motrin for better fever control, increase fluid intake.  Recheck here or by PCP for any persistent or worsening symptoms.  Patient was also seen by Dr. Roderic Palau during this visit.  Final Clinical Impressions(s) / ED Diagnoses   Final diagnoses:  Acute cystitis without hematuria  Community acquired pneumonia of left lower lobe of lung [facility])    ED Discharge Orders         Ordered    levofloxacin (LEVAQUIN) 750 MG tablet  Daily     03/02/18 1704           Evalee Jefferson, PA-C 03/03/18 1423    Milton Ferguson, MD 03/04/18 0830

## 2018-03-02 NOTE — ED Notes (Signed)
Pt provided more water as provided before

## 2018-03-02 NOTE — ED Triage Notes (Signed)
Pt c/o of generalized body aches, fever, sore throat since x 3 days.  Denies any n/v/d or abdominal pain

## 2018-03-02 NOTE — ED Notes (Signed)
Pt moved to rm 11 in acute

## 2018-03-02 NOTE — ED Notes (Signed)
To Rad 

## 2018-03-02 NOTE — ED Notes (Signed)
Reports her boss was recently sick w same sx  Fever for the last 2 days of 101.9  Has not seen nor spoken w PCP  Here for eval  Blanket removed from pt

## 2018-03-03 LAB — URINE CULTURE: CULTURE: NO GROWTH

## 2018-03-04 ENCOUNTER — Encounter (HOSPITAL_COMMUNITY): Payer: Self-pay | Admitting: Emergency Medicine

## 2018-03-04 ENCOUNTER — Other Ambulatory Visit: Payer: Self-pay

## 2018-03-04 ENCOUNTER — Inpatient Hospital Stay (HOSPITAL_COMMUNITY)
Admission: EM | Admit: 2018-03-04 | Discharge: 2018-03-07 | DRG: 810 | Disposition: A | Discharge status: Home / Self Care | Payer: BLUE CROSS/BLUE SHIELD | Attending: Internal Medicine | Admitting: Internal Medicine

## 2018-03-04 ENCOUNTER — Emergency Department (HOSPITAL_COMMUNITY): Payer: BLUE CROSS/BLUE SHIELD

## 2018-03-04 DIAGNOSIS — R197 Diarrhea, unspecified: Secondary | ICD-10-CM | POA: Diagnosis not present

## 2018-03-04 DIAGNOSIS — Z853 Personal history of malignant neoplasm of breast: Secondary | ICD-10-CM | POA: Diagnosis not present

## 2018-03-04 DIAGNOSIS — A419 Sepsis, unspecified organism: Secondary | ICD-10-CM | POA: Diagnosis not present

## 2018-03-04 DIAGNOSIS — Z87891 Personal history of nicotine dependence: Secondary | ICD-10-CM

## 2018-03-04 DIAGNOSIS — R000 Tachycardia, unspecified: Secondary | ICD-10-CM | POA: Diagnosis present

## 2018-03-04 DIAGNOSIS — Z880 Allergy status to penicillin: Secondary | ICD-10-CM

## 2018-03-04 DIAGNOSIS — D72819 Decreased white blood cell count, unspecified: Secondary | ICD-10-CM

## 2018-03-04 DIAGNOSIS — Z91040 Latex allergy status: Secondary | ICD-10-CM

## 2018-03-04 DIAGNOSIS — Z79899 Other long term (current) drug therapy: Secondary | ICD-10-CM | POA: Diagnosis not present

## 2018-03-04 DIAGNOSIS — Z818 Family history of other mental and behavioral disorders: Secondary | ICD-10-CM | POA: Diagnosis not present

## 2018-03-04 DIAGNOSIS — Z8249 Family history of ischemic heart disease and other diseases of the circulatory system: Secondary | ICD-10-CM | POA: Diagnosis not present

## 2018-03-04 DIAGNOSIS — I10 Essential (primary) hypertension: Secondary | ICD-10-CM | POA: Diagnosis present

## 2018-03-04 DIAGNOSIS — Z888 Allergy status to other drugs, medicaments and biological substances status: Secondary | ICD-10-CM | POA: Diagnosis not present

## 2018-03-04 DIAGNOSIS — Z9011 Acquired absence of right breast and nipple: Secondary | ICD-10-CM | POA: Diagnosis not present

## 2018-03-04 DIAGNOSIS — G894 Chronic pain syndrome: Secondary | ICD-10-CM | POA: Diagnosis present

## 2018-03-04 DIAGNOSIS — R509 Fever, unspecified: Secondary | ICD-10-CM | POA: Diagnosis present

## 2018-03-04 DIAGNOSIS — C50911 Malignant neoplasm of unspecified site of right female breast: Secondary | ICD-10-CM | POA: Diagnosis present

## 2018-03-04 DIAGNOSIS — G43909 Migraine, unspecified, not intractable, without status migrainosus: Secondary | ICD-10-CM | POA: Diagnosis present

## 2018-03-04 DIAGNOSIS — F418 Other specified anxiety disorders: Secondary | ICD-10-CM | POA: Diagnosis present

## 2018-03-04 DIAGNOSIS — D61818 Other pancytopenia: Secondary | ICD-10-CM

## 2018-03-04 DIAGNOSIS — Z833 Family history of diabetes mellitus: Secondary | ICD-10-CM

## 2018-03-04 HISTORY — DX: Pneumonia, unspecified organism: J18.9

## 2018-03-04 LAB — CBC WITH DIFFERENTIAL/PLATELET
BASOS PCT: 2 %
Basophils Absolute: 0 10*3/uL (ref 0.0–0.1)
EOS PCT: 21 %
Eosinophils Absolute: 0.1 10*3/uL (ref 0.0–0.7)
HCT: 33.6 % — ABNORMAL LOW (ref 36.0–46.0)
HCT: 28.8 % — ABNORMAL LOW (ref 36.0–46.0)
HEMOGLOBIN: 11.3 g/dL — AB (ref 12.0–15.0)
HEMOGLOBIN: 9.7 g/dL — AB (ref 12.0–15.0)
Lymphocytes Relative: 37 %
Lymphs Abs: 0.2 10*3/uL — ABNORMAL LOW (ref 0.7–4.0)
MCH: 29.7 pg (ref 26.0–34.0)
MCH: 29.8 pg (ref 26.0–34.0)
MCHC: 33.6 g/dL (ref 30.0–36.0)
MCHC: 33.7 g/dL (ref 30.0–36.0)
MCV: 88.4 fL (ref 78.0–100.0)
MCV: 88.3 fL (ref 78.0–100.0)
MONOS PCT: 15 %
Monocytes Absolute: 0.1 10*3/uL (ref 0.1–1.0)
NEUTROS PCT: 25 %
Neutro Abs: 0.1 10*3/uL — ABNORMAL LOW (ref 1.7–7.7)
PLATELETS: 106 10*3/uL — AB (ref 150–400)
Platelets: 93 10*3/uL — ABNORMAL LOW (ref 150–400)
RBC: 3.8 MIL/uL — AB (ref 3.87–5.11)
RBC: 3.26 MIL/uL — AB (ref 3.87–5.11)
RDW: 13.6 % (ref 11.5–15.5)
RDW: 13.6 % (ref 11.5–15.5)
WBC: 0.5 10*3/uL — AB (ref 4.0–10.5)
WBC: 0.6 10*3/uL — AB (ref 4.0–10.5)

## 2018-03-04 LAB — COMPREHENSIVE METABOLIC PANEL
ALK PHOS: 39 U/L (ref 38–126)
ALT: 24 U/L (ref 0–44)
ALT: 20 U/L (ref 0–44)
AST: 34 U/L (ref 15–41)
AST: 28 U/L (ref 15–41)
Albumin: 3.1 g/dL — ABNORMAL LOW (ref 3.5–5.0)
Albumin: 2.6 g/dL — ABNORMAL LOW (ref 3.5–5.0)
Alkaline Phosphatase: 46 U/L (ref 38–126)
Anion gap: 7 (ref 5–15)
Anion gap: 4 — ABNORMAL LOW (ref 5–15)
BUN: 7 mg/dL (ref 6–20)
BUN: 6 mg/dL (ref 6–20)
CALCIUM: 6.7 mg/dL — AB (ref 8.9–10.3)
CO2: 22 mmol/L (ref 22–32)
CO2: 19 mmol/L — AB (ref 22–32)
CREATININE: 0.81 mg/dL (ref 0.44–1.00)
Calcium: 7.9 mg/dL — ABNORMAL LOW (ref 8.9–10.3)
Chloride: 102 mmol/L (ref 98–111)
Chloride: 111 mmol/L (ref 98–111)
Creatinine, Ser: 0.92 mg/dL (ref 0.44–1.00)
GFR calc non Af Amer: >60 mL/min (ref >60)
Glucose, Bld: 102 mg/dL — ABNORMAL HIGH (ref 70–99)
Glucose, Bld: 102 mg/dL — ABNORMAL HIGH (ref 70–99)
POTASSIUM: 3.6 mmol/L (ref 3.5–5.1)
Potassium: 3.2 mmol/L — ABNORMAL LOW (ref 3.5–5.1)
SODIUM: 131 mmol/L — AB (ref 135–145)
SODIUM: 134 mmol/L — AB (ref 135–145)
Total Bilirubin: 0.7 mg/dL (ref 0.3–1.2)
Total Bilirubin: 1.3 mg/dL — ABNORMAL HIGH (ref 0.3–1.2)
Total Protein: 8 g/dL (ref 6.5–8.1)
Total Protein: 6.6 g/dL (ref 6.5–8.1)

## 2018-03-04 LAB — URINALYSIS, ROUTINE W REFLEX MICROSCOPIC
Bilirubin Urine: NEGATIVE
Glucose, UA: NEGATIVE mg/dL
KETONES UR: 20 mg/dL — AB
Leukocytes, UA: NEGATIVE
Nitrite: NEGATIVE
PROTEIN: NEGATIVE mg/dL
Specific Gravity, Urine: 1.021 (ref 1.005–1.030)
pH: 5 (ref 5.0–8.0)

## 2018-03-04 LAB — PROCALCITONIN

## 2018-03-04 LAB — PROTIME-INR
INR: 1.21
Prothrombin Time: 15.2 seconds (ref 11.4–15.2)

## 2018-03-04 LAB — LACTIC ACID, PLASMA
LACTIC ACID, VENOUS: 0.8 mmol/L (ref 0.5–1.9)
Lactic Acid, Venous: 0.7 mmol/L (ref 0.5–1.9)
Lactic Acid, Venous: 2.2 mmol/L (ref 0.5–1.9)

## 2018-03-04 LAB — MRSA PCR SCREENING: MRSA by PCR: NEGATIVE

## 2018-03-04 LAB — APTT: aPTT: 32 seconds (ref 24–36)

## 2018-03-04 LAB — I-STAT CG4 LACTIC ACID, ED: Lactic Acid, Venous: 1.11 mmol/L (ref 0.5–1.9)

## 2018-03-04 MED ORDER — ONDANSETRON HCL 4 MG/2ML IJ SOLN: 4.0000 mg | Freq: Four times a day (QID) | INTRAMUSCULAR | Status: DC | PRN

## 2018-03-04 MED ORDER — SODIUM CHLORIDE 0.9 % IV SOLN
2.0000 g | Freq: Three times a day (TID) | INTRAVENOUS | Status: DC
  Administered 2018-03-04 – 2018-03-06 (×5): 2 g via INTRAVENOUS
  Filled 2018-03-04 (×11): qty 2

## 2018-03-04 MED ORDER — SODIUM CHLORIDE 0.9 % IV BOLUS
500.0000 mL | Freq: Once | INTRAVENOUS | Status: AC
  Administered 2018-03-05: 500 mL via INTRAVENOUS

## 2018-03-04 MED ORDER — DOXYCYCLINE HYCLATE 100 MG PO TABS
100.0000 mg | ORAL_TABLET | Freq: Two times a day (BID) | ORAL | Status: DC
  Administered 2018-03-04 – 2018-03-07 (×6): 100 mg via ORAL
  Filled 2018-03-04 (×6): qty 1

## 2018-03-04 MED ORDER — POTASSIUM CHLORIDE IN NACL 20-0.9 MEQ/L-% IV SOLN
INTRAVENOUS | Status: DC
  Administered 2018-03-04 – 2018-03-06 (×3): via INTRAVENOUS

## 2018-03-04 MED ORDER — SODIUM CHLORIDE 0.9 % IV SOLN
100.0000 mg | Freq: Once | INTRAVENOUS | Status: AC
  Administered 2018-03-04: 100 mg via INTRAVENOUS
  Filled 2018-03-04: qty 100

## 2018-03-04 MED ORDER — SODIUM CHLORIDE 0.9 % IV BOLUS
2000.0000 mL | Freq: Once | INTRAVENOUS | Status: AC
  Administered 2018-03-04: 2000 mL via INTRAVENOUS

## 2018-03-04 MED ORDER — SERTRALINE HCL 50 MG PO TABS: 100.0000 mg | ORAL_TABLET | Freq: Every day | ORAL | Status: DC

## 2018-03-04 MED ORDER — ACETAMINOPHEN 325 MG PO TABS
650.0000 mg | ORAL_TABLET | Freq: Once | ORAL | Status: AC
  Administered 2018-03-04: 650 mg via ORAL
  Filled 2018-03-04: qty 2

## 2018-03-04 MED ORDER — VANCOMYCIN HCL IN DEXTROSE 1-5 GM/200ML-% IV SOLN: 1000.0000 mg | Freq: Once | INTRAVENOUS | Status: DC

## 2018-03-04 MED ORDER — DIPHENHYDRAMINE HCL 50 MG/ML IJ SOLN
25.0000 mg | Freq: Once | INTRAMUSCULAR | Status: AC
  Administered 2018-03-04: 25 mg via INTRAVENOUS
  Filled 2018-03-04: qty 1

## 2018-03-04 MED ORDER — ACETAMINOPHEN 325 MG PO TABS
650.0000 mg | ORAL_TABLET | Freq: Four times a day (QID) | ORAL | Status: DC | PRN
  Administered 2018-03-04 – 2018-03-05 (×3): 650 mg via ORAL
  Filled 2018-03-04 (×4): qty 2

## 2018-03-04 MED ORDER — SODIUM CHLORIDE 0.9 % IV SOLN: 2.0000 g | Freq: Once | INTRAVENOUS | Status: DC

## 2018-03-04 MED ORDER — ONDANSETRON HCL 4 MG PO TABS: 4.0000 mg | ORAL_TABLET | Freq: Four times a day (QID) | ORAL | Status: DC | PRN

## 2018-03-04 MED ORDER — ACETAMINOPHEN 650 MG RE SUPP: 650.0000 mg | Freq: Four times a day (QID) | RECTAL | Status: DC | PRN

## 2018-03-04 MED ORDER — VANCOMYCIN HCL 10 G IV SOLR
2000.0000 mg | Freq: Once | INTRAVENOUS | Status: AC
  Administered 2018-03-04: 2000 mg via INTRAVENOUS
  Filled 2018-03-04: qty 2000

## 2018-03-04 MED ORDER — SODIUM CHLORIDE 0.9 % IV SOLN
2.0000 g | Freq: Once | INTRAVENOUS | Status: AC
  Administered 2018-03-04: 2 g via INTRAVENOUS
  Filled 2018-03-04: qty 2

## 2018-03-04 MED ORDER — METRONIDAZOLE IN NACL 5-0.79 MG/ML-% IV SOLN
500.0000 mg | Freq: Three times a day (TID) | INTRAVENOUS | Status: DC
  Administered 2018-03-04 – 2018-03-05 (×2): 500 mg via INTRAVENOUS
  Filled 2018-03-04 (×2): qty 100

## 2018-03-04 MED ORDER — SERTRALINE HCL 50 MG PO TABS
100.0000 mg | ORAL_TABLET | Freq: Every day | ORAL | Status: DC
  Administered 2018-03-04 – 2018-03-07 (×4): 100 mg via ORAL
  Filled 2018-03-04 (×4): qty 2

## 2018-03-04 MED ORDER — PROPRANOLOL HCL ER 60 MG PO CP24
60.0000 mg | ORAL_CAPSULE | Freq: Every day | ORAL | Status: DC
  Administered 2018-03-04 – 2018-03-06 (×3): 60 mg via ORAL
  Filled 2018-03-04 (×6): qty 1

## 2018-03-04 MED ORDER — VANCOMYCIN HCL IN DEXTROSE 1-5 GM/200ML-% IV SOLN
1000.0000 mg | Freq: Three times a day (TID) | INTRAVENOUS | Status: DC
  Administered 2018-03-04 – 2018-03-06 (×5): 1000 mg via INTRAVENOUS
  Filled 2018-03-04 (×5): qty 200

## 2018-03-04 NOTE — Progress Notes (Signed)
Pharmacy Antibiotic Note  Amy Douglas is a 47 y.o. female admitted on 03/04/2018 with infection-unknown source.  Pharmacy has been consulted for Vancomycin and Aztreonam dosing.  Plan: Vancomycin 2000 mg IV x 1 dose Vancomycin 1000 mg IV every 8 hours.  Goal trough 15-20 mcg/mL.  Aztreonam 2000 mg IV every 8 hours Doxycyline 100 mg PO twice daily Monitor labs, c/s, and patient improvement  Height: 5\' 8"  (172.7 cm) Weight: 219 lb 9.3 oz (99.6 kg) IBW/kg (Calculated) : 63.9  Temp (24hrs), Avg:100.7 F (38.2 C), Min:99.2 F (37.3 C), Max:102.9 F (39.4 C)  Recent Labs  Lab 03/02/18 1338 03/02/18 1347 03/04/18 1032 03/04/18 1039 03/04/18 1259 03/04/18 1546 03/04/18 1554  WBC 2.5*  --  0.5*  --   --  0.6*  --   CREATININE 1.10*  --  0.92  --   --  0.81  --   LATICACIDVEN  --  0.7  --  1.11 0.7  --  0.8    Estimated Creatinine Clearance: 108.3 mL/min (by C-G formula based on SCr of 0.81 mg/dL).    Allergies  Allergen Reactions  . Hydroxyzine Pamoate Itching  . Amoxicillin-Pot Clavulanate Rash    Augmentin   . Latex Other (See Comments)    Rash and skin blistering  . Other Other (See Comments)    States she has a bad reaction to all steroids: fever, insomnia, ams  . Diclofenac     Upsets stomach  . Prednisone     AMS  . Vistaril [Hydroxyzine Hcl]     Lip Swelling     Antimicrobials this admission: Vanco 9/1 >>  Cefepime 9/1 >>  Doxy 9/1 >> Flagyl 9/1 >>   Microbiology results: 9/ BCx: pending 9/1 MRSA PCR: pending  Thank you for allowing pharmacy to be a part of this patient's care.  Ramond Craver 03/04/2018 8:19 PM

## 2018-03-04 NOTE — H&P (Signed)
History and Physical  Amy Douglas SFK:[id number] DOB: 1970-11-20 DOA: 03/04/2018   PCP: [name], MD   Patient coming from: Home  Chief Complaint: fever, rash  HPI:  Amy Douglas is a 47 y.o. female with medical history of invasive ductal carcinoma the right breast, migraine headache, anxiety/depression, and chronic pain syndrome presenting with fever, rash, and arthralgias that began on 02/28/2018.  The patient actually began developing a fever up to 101.9 F on 02/28/2018.  She went to the emergency department on 03/02/2018 with the above symptoms.  Blood cultures, urine cultures, and group A streptococcus PCR was performed which have been negative to this point.  Chest x-ray on that day suggested a possible retrocardiac opacity.  The patient was sent home with levofloxacin.  She took 1 dose of levofloxacin on 03/03/2018.  She continued to have fevers.  The patient subsequently developed a maculopapular erythematous rash on her face, trunk, arms, and legs that she noticed on the morning of 03/04/2018.  As result, she presented to emergency department for further evaluation.  Notably, the patient was also recently started on lamotrigine for possible seizure on 02/15/2018.  She has been on a gradual titration and is currently taking 50 mg twice daily.  The patient states that she has been exposed to a coworker who had high fevers this past week.  She denies any neck pain, visual disturbance, chest pain, shortness breath, coughing, coryza, hemoptysis, nausea, vomiting, diarrhea, abdominal pain, dysuria, hematuria, hematochezia, melena.  She denies any frank synovitis but has arthralgias and myalgias.  She has been taking Goody's for her fever and arthralgias.  She has not been started on any other additional medications other than discussed above.  She denies any recent camping trips, hiking, fishing.  She has 2 dogs at home but denies any other exotic pets.  She has not had any travels outside in  [place] in the past month. In the emergency department, the patient was febrile up to 102.9 F with mild tachycardia, but she was hemodynamically stable saturating 100% on room air.  WBC was 0.5 with hemoglobin 11.3 and platelets 106,000.  BMP showed a sodium 131.  LFTs were essentially unremarkable.  Lactic acid was 1.11.  Chest x-ray did not show consolidation.  Urinalysis was negative for pyuria.  The patient was admitted for further work-up and treatment.  Assessment/Plan: Fever/pancytopenia/rash -Concerned about overwhelming sepsis -Differential diagnosis is broad including possible tickborne illness, viral exanthem, and medication effect and acute leukemia (less likely) -Start empiric vancomycin, aztreonam, metronidazole -Follow blood and urine cultures -Ehrlichia and RMSF serologies -Parvovirus serologies and PCR -RPR -EBV DNA -CMV DNA -Trend procalcitonin -Continue empiric doxycycline -CBC with differential -Discontinue levofloxacin and lamotrigine--patient was febrile and leukopenic even prior to starting levofloxacin -Urinalysis negative for pyuria -Continue IV fluids  Migraine headaches -Continue propranolol  Depression/anxiety -Continue Zoloft          Past Medical History:  Diagnosis Date  . Breast cancer ([place])   . Pneumonia    Past Surgical History:  Procedure Laterality Date  . MASTECTOMY    . maxillofacial Bilateral    Social History:  reports that she quit smoking about 16 years ago. Her smoking use included cigarettes. She quit after 12.00 years of use. She has never used smokeless tobacco. She reports that she does not drink alcohol or use drugs.   Family History  Problem Relation Age of Onset  . Hypertension Mother   . Diabetes Father   .  Dementia Father   . Heart disease Father      Allergies  Allergen Reactions  . Hydroxyzine Pamoate Itching  . Amoxicillin-Pot Clavulanate Rash    Augmentin   . Latex Other (See Comments)     Rash and skin blistering  . Other Other (See Comments)    States she has a bad reaction to all steroids: fever, insomnia, ams  . Diclofenac     Upsets stomach  . Prednisone     AMS  . Vistaril [Hydroxyzine Hcl]     Lip Swelling      Prior to Admission medications   Medication Sig Start Date End Date Taking? Authorizing Provider  lamoTRIgine (LAMICTAL) 25 MG tablet Take 25 mg bid for 1 week, followed by  2 weeks at 50 mg bid, than 75 mg bid po for 2 weeks, than 100 mg bid for 2 weeks and stay there. Patient taking differently: Take 50 mg by mouth 2 (two) times daily. Take 25 mg bid for 1 week, followed by  2 weeks at 50 mg bid, than 75 mg bid po for 2 weeks, than 100 mg bid for 2 weeks and stay there. 02/15/18   Dohmeier, Asencion Partridge, MD  levofloxacin (LEVAQUIN) 750 MG tablet Take 1 tablet (750 mg total) by mouth daily. 03/02/18   Evalee Jefferson, PA-C  propranolol ER (INDERAL LA) 60 MG 24 hr capsule Take 1 capsule (60 mg total) by mouth daily. 09/27/17   [name], MD  sertraline (ZOLOFT) 100 MG tablet Take 1 tablet (100 mg total) by mouth daily. 03/28/17   [name], MD    Review of Systems:  Constitutional:  No weight loss, night sweats, Head&Eyes: No headache.  No vision loss.  No eye pain or scotoma ENT:  No Difficulty swallowing,Tooth/dental problems,Sore throat,  No ear ache, post nasal drip,  Cardio-vascular:  No chest pain, Orthopnea, PND, swelling in lower extremities,  dizziness, palpitations  GI:  No  abdominal pain, nausea, vomiting, diarrhea, loss of appetite, hematochezia, melena, heartburn, indigestion, Resp:  No shortness of breath with exertion or at rest. No cough. No coughing up of blood .No wheezing.No chest wall deformity  Skin:  nolesions.  GU:  no dysuria, change in color of urine, no urgency or frequency. No flank pain.  Musculoskeletal:  No joint pain or swelling. No decreased range of motion. No back pain.  Psych:  No change in mood or affect. No  depression or anxiety. Neurologic: No headache, no dysesthesia, no focal weakness, no vision loss. No syncope  Physical Exam: Vitals:   03/04/18 1127 03/04/18 1130 03/04/18 1200 03/04/18 1300  BP:  126/71 112/72   Pulse:  (!) 105 (!) 103 99  Resp:  (!) 23 (!) 24 16  Temp: (!) 102.9 F (39.4 C)     TempSrc: Oral     SpO2:  99% 99% 100%  Weight:      Height:       General:  A&O x 3, NAD, nontoxic, pleasant/cooperative Head/Eye: No conjunctival hemorrhage, no icterus, [place]/AT, No nystagmus ENT:  No icterus,  No thrush, good dentition, no pharyngeal exudate Neck:  No masses, no lymphadenpathy, no bruits CV:  RRR, no rub, no gallop, no S3 Lung:  Bibasilar rales, no wheeze Abdomen: soft/NT, +BS, nondistended, no peritoneal signs Ext: No cyanosis, No rashes, No petechiae, No lymphangitis, No edema; diffuse maculopapular erythematous rash on face, neck, trunk, extremities Neuro: CNII-XII intact, strength 4/5 in bilateral upper and lower extremities, no dysmetria  Labs  on Admission:  Basic Metabolic Panel: Recent Labs  Lab 03/02/18 1338 03/04/18 1032  NA 132* 131*  K 3.9 3.6  CL 104 102  CO2 23 22  GLUCOSE 99 102*  BUN 9 7  CREATININE 1.10* 0.92  CALCIUM 8.2* 7.9*   Liver Function Tests: Recent Labs  Lab 03/02/18 1338 03/04/18 1032  AST 24 34  ALT 18 24  ALKPHOS 53 46  BILITOT 0.5 0.7  PROT 8.3* 8.0  ALBUMIN 3.3* 3.1*   No results for input(s): LIPASE, AMYLASE in the last 168 hours. No results for input(s): AMMONIA in the last 168 hours. CBC: Recent Labs  Lab 03/02/18 1338 03/04/18 1032  WBC 2.5* 0.5*  NEUTROABS 1.6* 0.1*  HGB 11.8* 11.3*  HCT 35.2* 33.6*  MCV 90.5 88.4  PLT 123* 106*   Coagulation Profile: No results for input(s): INR, PROTIME in the last 168 hours. Cardiac Enzymes: No results for input(s): CKTOTAL, CKMB, CKMBINDEX, TROPONINI in the last 168 hours. BNP: Invalid input(s): POCBNP CBG: No results for input(s): GLUCAP in the last 168  hours. Urine analysis:    Component Value Date/Time   COLORURINE YELLOW 03/04/2018 1046   APPEARANCEUR HAZY (A) 03/04/2018 1046   LABSPEC 1.021 03/04/2018 1046   PHURINE 5.0 03/04/2018 1046   GLUCOSEU NEGATIVE 03/04/2018 1046   HGBUR SMALL (A) 03/04/2018 1046   BILIRUBINUR NEGATIVE 03/04/2018 1046   KETONESUR 20 (A) 03/04/2018 1046   PROTEINUR NEGATIVE 03/04/2018 1046   NITRITE NEGATIVE 03/04/2018 1046   LEUKOCYTESUR NEGATIVE 03/04/2018 1046   Sepsis Labs: @LABRCNTIP (procalcitonin:4,lacticidven:4) ) Recent Results (from the past 240 hour(s))  Group A Strep by PCR     Status: None   Collection Time: 03/02/18  1:09 PM  Result Value Ref Range Status   Group A Strep by PCR NOT DETECTED NOT DETECTED Final    Comment: Performed at [facility], [address], [place], [place] [address]  Urine Culture     Status: None   Collection Time: 03/02/18  1:09 PM  Result Value Ref Range Status   Specimen Description   Final    URINE, CLEAN CATCH Performed at [facility], [address] [place], [place] [address]    Special Requests   Final    NONE Performed at [facility], [address] [place], [place] [address]    Culture   Final    NO GROWTH Performed at [facility], [place] [address], [place], [place] [address]    Report Status 03/03/2018 FINAL  Final  Blood Culture (routine x 2)     Status: None (Preliminary result)   Collection Time: 03/02/18  1:45 PM  Result Value Ref Range Status   Specimen Description   Final    LEFT ANTECUBITAL BOTTLES DRAWN AEROBIC AND ANAEROBIC   Special Requests Blood Culture adequate volume  Final   Culture   Final    NO GROWTH 2 DAYS Performed at [facility], [address], [place], [place] [address]    Report Status PENDING  Incomplete  Blood Culture (routine x 2)     Status: None (Preliminary result)   Collection Time: 03/02/18  1:47 PM  Result Value Ref Range Status   Specimen Description   Final    BLOOD LEFT HAND BOTTLES DRAWN  AEROBIC AND ANAEROBIC   Special Requests   Final    Blood Culture results may not be optimal due to an inadequate volume of blood received in culture bottles   Culture   Final  NO GROWTH 2 DAYS Performed at [facility], [address], [place], [place] [address]    Report Status PENDING  Incomplete  Blood Culture (routine x 2)     Status: None (Preliminary result)   Collection Time: 03/04/18 10:16 AM  Result Value Ref Range Status   Specimen Description BLOOD LEFT HAND  Final   Special Requests   Final    BOTTLES DRAWN AEROBIC AND ANAEROBIC Blood Culture adequate volume Performed at [facility], [address] [place], [place] [address]    Culture PENDING  Incomplete   Report Status PENDING  Incomplete  Blood Culture (routine x 2)     Status: None (Preliminary result)   Collection Time: 03/04/18 10:32 AM  Result Value Ref Range Status   Specimen Description LEFT ANTECUBITAL  Final   Special Requests   Final    Blood Culture adequate volume BOTTLES DRAWN AEROBIC AND ANAEROBIC Performed at [facility], [address], [place], [place] [address]    Culture PENDING  Incomplete   Report Status PENDING  Incomplete     Radiological Exams on Admission: Dg Chest 2 View  Result Date: 03/04/2018 CLINICAL DATA:  Cough, fever, recent pneumonia EXAM: CHEST - 2 VIEW COMPARISON:  03/02/2018 FINDINGS: The heart size and mediastinal contours are within normal limits. Both lungs are clear. The visualized skeletal structures are unremarkable. Trachea is midline. No large effusion or pneumothorax. Previous lower cervical fusion. Chronic osseous changes over the right upper chest. IMPRESSION: No active cardiopulmonary disease. Electronically Signed   By: Jerilynn Mages.  Shick M.D.   On: 03/04/2018 11:10    EKG: Independently reviewed. Sinus tach, no STT changes    Time spent:60 minutes Code Status:   FULL Family Communication:  No Family at bedside Disposition Plan: expect 2-3 day  hospitalization Consults called: none DVT Prophylaxis: SCDs  Orson Eva, DO  [facility] Pager [phone]  If 7PM-7AM, please contact night-coverage [email or web address] Password [email or web address] 03/04/2018, 1:09 PM

## 2018-03-04 NOTE — ED Notes (Signed)
Spoke with Otila Kluver from lab. She went in to draw blood for pt's ordered repeat lactic. Pt's husband was at bedside. He stated that they had just drawn blood. He took out his phone and had pictures of Ria Comment with phlebotomy drawing the pt's blood. He also had taken pictures of all the tubes of blood drawn. AC and security notified.

## 2018-03-04 NOTE — Progress Notes (Signed)
CRITICAL VALUE ALERT  Critical Value:  Lactic acid 2.2  Date & Time Notied:  03/04/2018  2315  Provider Notified: Bodenheimer  Orders Received/Actions taken: no new orders at this time. Will continue to monitor.

## 2018-03-04 NOTE — ED Triage Notes (Addendum)
Patient seen here in ER on Friday and diagnosed with UTI and pneumonia. Patient given prescription for Levaquin. Per patient her skin became flushed in color, dry mouth, and face is itching after starting medication. Denies any difficulty breathing or swallowing. Patient's last dose of medication was at 12pm yesterday.  Denies any improvement in symptoms. Per patient highest fever was 101.7. Patient taking tylenol and ibuprofen. Last medication taken was ibuprofen at 4am this morning. Denies any nausea, vomiting, or diarrhea.

## 2018-03-04 NOTE — ED Notes (Signed)
EDP at bedside  

## 2018-03-04 NOTE — ED Notes (Signed)
Date and time results received: 03/04/18 1120   Test: WBC Critical Value: 0.5  Name of Provider Notified: Dr. Roderic Palau  Orders Received? Or Actions Taken?: See chart

## 2018-03-04 NOTE — ED Provider Notes (Signed)
[facility] EMERGENCY DEPARTMENT Provider Note   CSN: [id number] Arrival date & time: 03/04/18  8676     History   Chief Complaint Chief Complaint  Patient presents with  . Allergic Reaction    HPI Amy Douglas is a 47 y.o. female.  Patient complains of a sore throat cough and fever.  She was seen on Friday and treated for possible pneumonia and urinary tract infection.  Patient returns today with rash and fever weakness.  Urine culture was negative blood cultures were negative  The history is provided by the patient. No language interpreter was used.  Illness  This is a new problem. The current episode started 12 to 24 hours ago. The problem occurs constantly. The problem has not changed since onset.Pertinent negatives include no chest pain, no abdominal pain and no headaches. Nothing aggravates the symptoms. Nothing relieves the symptoms. Treatments tried: Antibiotic. The treatment provided no relief.    Past Medical History:  Diagnosis Date  . Breast cancer ([place])   . Pneumonia     Patient Active Problem List   Diagnosis Date Noted  . HTN, goal below 140/90 08/07/2017  . Low HDL (under 40) 08/07/2017  . Migraine without status migrainosus, not intractable 03/28/2017  . History of migraine headaches 03/28/2017  . Seasonal allergic rhinitis due to pollen 03/28/2017  . Need for immunization against influenza 03/28/2017  . Anxiety 03/28/2017  . Port-A-Cath in place 04/09/2015  . Carcinoma of right breast ([place]) 03/31/2015  . Chronic pain syndrome 03/31/2015  . Status post right mastectomy 03/31/2015    Past Surgical History:  Procedure Laterality Date  . MASTECTOMY    . maxillofacial Bilateral      OB History   None      Home Medications    Prior to Admission medications   Medication Sig Start Date End Date Taking? Authorizing Provider  lamoTRIgine (LAMICTAL) 25 MG tablet Take 25 mg bid for 1 week, followed by  2 weeks at 50 mg bid, than 75 mg bid po for 2  weeks, than 100 mg bid for 2 weeks and stay there. Patient taking differently: Take 50 mg by mouth 2 (two) times daily. Take 25 mg bid for 1 week, followed by  2 weeks at 50 mg bid, than 75 mg bid po for 2 weeks, than 100 mg bid for 2 weeks and stay there. 02/15/18   Dohmeier, Asencion Partridge, MD  levofloxacin (LEVAQUIN) 750 MG tablet Take 1 tablet (750 mg total) by mouth daily. 03/02/18   Evalee Jefferson, PA-C  propranolol ER (INDERAL LA) 60 MG 24 hr capsule Take 1 capsule (60 mg total) by mouth daily. 09/27/17   [name], MD  sertraline (ZOLOFT) 100 MG tablet Take 1 tablet (100 mg total) by mouth daily. 03/28/17   [name], MD    Family History Family History  Problem Relation Age of Onset  . Hypertension Mother   . Diabetes Father   . Dementia Father   . Heart disease Father     Social History Social History   Tobacco Use  . Smoking status: Former Smoker    Years: 12.00    Types: Cigarettes    Last attempt to quit: 06/30/2001    Years since quitting: 16.6  . Smokeless tobacco: Never Used  Substance Use Topics  . Alcohol use: No  . Drug use: No     Allergies   Hydroxyzine pamoate; Amoxicillin-pot clavulanate; Latex; Other; Diclofenac; Prednisone; and Vistaril [hydroxyzine hcl]   Review of  Systems Review of Systems  Constitutional: Positive for fever. Negative for appetite change and fatigue.  HENT: Negative for congestion, ear discharge and sinus pressure.   Eyes: Negative for discharge.  Respiratory: Negative for cough.   Cardiovascular: Negative for chest pain.  Gastrointestinal: Negative for abdominal pain and diarrhea.  Genitourinary: Negative for frequency and hematuria.  Musculoskeletal: Negative for back pain.  Skin: Negative for rash.  Neurological: Negative for seizures and headaches.  Psychiatric/Behavioral: Negative for hallucinations.     Physical Exam Updated Vital Signs BP 112/72   Pulse (!) 103   Temp (!) 102.9 F (39.4 C) (Oral)   Resp (!) 24    Ht 5\' 8"  (1.727 m)   Wt 98.4 kg   LMP 02/26/2018   SpO2 99%   BMI 32.99 kg/m   Physical Exam  Constitutional: She is oriented to person, place, and time. She appears well-developed.  HENT:  Head: Normocephalic.  Eyes: Conjunctivae and EOM are normal. No scleral icterus.  Neck: Neck supple. No thyromegaly present.  Cardiovascular: Normal rate and regular rhythm. Exam reveals no gallop and no friction rub.  No murmur heard. Pulmonary/Chest: No stridor. She has no wheezes. She has no rales. She exhibits no tenderness.  Abdominal: She exhibits no distension. There is no tenderness. There is no rebound.  Musculoskeletal: Normal range of motion. She exhibits no edema.  Lymphadenopathy:    She has no cervical adenopathy.  Neurological: She is oriented to person, place, and time. She exhibits normal muscle tone. Coordination normal.  Skin: No rash noted. There is erythema.  Psychiatric: She has a normal mood and affect. Her behavior is normal.     ED Treatments / Results  Labs (all labs ordered are listed, but only abnormal results are displayed) Labs Reviewed  COMPREHENSIVE METABOLIC PANEL - Abnormal; Notable for the following components:      Result Value   Sodium 131 (*)    Glucose, Bld 102 (*)    Calcium 7.9 (*)    Albumin 3.1 (*)    All other components within normal limits  CBC WITH DIFFERENTIAL/PLATELET - Abnormal; Notable for the following components:   WBC 0.5 (*)    RBC 3.80 (*)    Hemoglobin 11.3 (*)    HCT 33.6 (*)    Platelets 106 (*)    Neutro Abs 0.1 (*)    Lymphs Abs 0.2 (*)    All other components within normal limits  URINALYSIS, ROUTINE W REFLEX MICROSCOPIC - Abnormal; Notable for the following components:   APPearance HAZY (*)    Hgb urine dipstick SMALL (*)    Ketones, ur 20 (*)    Bacteria, UA RARE (*)    All other components within normal limits  CULTURE, BLOOD (ROUTINE X 2)  CULTURE, BLOOD (ROUTINE X 2)  ROCKY MTN SPOTTED FVR ABS PNL(IGG+IGM)  B.  BURGDORFI ANTIBODIES  LACTIC ACID, PLASMA  LACTIC ACID, PLASMA  I-STAT CG4 LACTIC ACID, ED    EKG None  Radiology Dg Chest 2 View  Result Date: 03/04/2018 CLINICAL DATA:  Cough, fever, recent pneumonia EXAM: CHEST - 2 VIEW COMPARISON:  03/02/2018 FINDINGS: The heart size and mediastinal contours are within normal limits. Both lungs are clear. The visualized skeletal structures are unremarkable. Trachea is midline. No large effusion or pneumothorax. Previous lower cervical fusion. Chronic osseous changes over the right upper chest. IMPRESSION: No active cardiopulmonary disease. Electronically Signed   By: Jerilynn Mages.  Shick M.D.   On: 03/04/2018 11:10   Dg Chest  2 View  Result Date: 03/02/2018 CLINICAL DATA:  Fever and body aches for the past 2 days. EXAM: CHEST - 2 VIEW COMPARISON:  None. FINDINGS: The heart size and mediastinal contours are within normal limits. Normal pulmonary vascularity. Subtle increased patchy density in the retrocardiac left lower lobe. No pleural effusion or pneumothorax. Lower cervical ACDF. No acute osseous abnormality. IMPRESSION: Mild patchy density in the retrocardiac left lower lobe, suspicious for pneumonia. Electronically Signed   By: Titus Dubin M.D.   On: 03/02/2018 12:59    Procedures Procedures (including critical care time)  Medications Ordered in ED Medications  doxycycline (VIBRAMYCIN) 100 mg in sodium chloride 0.9 % 250 mL IVPB (100 mg Intravenous New Bag/Given 03/04/18 1125)  sodium chloride 0.9 % bolus 2,000 mL (2,000 mLs Intravenous New Bag/Given 03/04/18 1020)  diphenhydrAMINE (BENADRYL) injection 25 mg (25 mg Intravenous Given 03/04/18 1044)  acetaminophen (TYLENOL) tablet 650 mg (650 mg Oral Given 03/04/18 1043)     Initial Impression / Assessment and Plan / ED Course  I have reviewed the triage vital signs and the nursing notes.  Pertinent labs & imaging results that were available during my care of the patient were reviewed by me and considered in  my medical decision making (see chart for details).    CRITICAL CARE Performed by: Milton Ferguson Total critical care time: 35 minutes Critical care time was exclusive of separately billable procedures and treating other patients. Critical care was necessary to treat or prevent imminent or life-threatening deterioration. Critical care was time spent personally by me on the following activities: development of treatment plan with patient and/or surrogate as well as nursing, discussions with consultants, evaluation of patient's response to treatment, examination of patient, obtaining history from patient or surrogate, ordering and performing treatments and interventions, ordering and review of laboratory studies, ordering and review of radiographic studies, pulse oximetry and re-evaluation of patient's condition.   Patient was continued fever.  Chest x-ray does not show pneumonia now urine cultures and blood cultures were negative couple days ago.  Patient has significant neutropenia.  She will be admitted for fever and neutropenia Final Clinical Impressions(s) / ED Diagnoses   Final diagnoses:  Leukopenia, unspecified type    ED Discharge Orders    None       Milton Ferguson, MD 03/04/18 1249

## 2018-03-05 LAB — CBC WITH DIFFERENTIAL/PLATELET
BASOS ABS: 0 10*3/uL (ref 0.0–0.1)
BASOS PCT: 1 %
EOS PCT: 9 %
Eosinophils Absolute: 0.1 10*3/uL (ref 0.0–0.7)
HEMATOCRIT: 29.2 % — AB (ref 36.0–46.0)
Hemoglobin: 9.8 g/dL — ABNORMAL LOW (ref 12.0–15.0)
LYMPHS PCT: 53 %
Lymphs Abs: 0.4 10*3/uL (ref 0.7–4.0)
MCH: 29.8 pg (ref 26.0–34.0)
MCHC: 33.6 g/dL (ref 30.0–36.0)
MCV: 88.8 fL (ref 78.0–100.0)
MONO ABS: 0.2 10*3/uL (ref 0.1–1.0)
Monocytes Relative: 22 %
NEUTROS ABS: 0.1 10*3/uL (ref 1.7–7.7)
Neutrophils Relative %: 15 %
PLATELETS: 128 10*3/uL — AB (ref 150–400)
RBC: 3.29 MIL/uL — AB (ref 3.87–5.11)
RDW: 13.8 % (ref 11.5–15.5)
WBC: 0.8 10*3/uL — AB (ref 4.0–10.5)

## 2018-03-05 LAB — COMPREHENSIVE METABOLIC PANEL
ALK PHOS: 36 U/L — AB (ref 38–126)
ALT: 24 U/L (ref 0–44)
AST: 34 U/L (ref 15–41)
Albumin: 2.6 g/dL — ABNORMAL LOW (ref 3.5–5.0)
Anion gap: 5 (ref 5–15)
BILIRUBIN TOTAL: 0.6 mg/dL (ref 0.3–1.2)
BUN: 6 mg/dL (ref 6–20)
CALCIUM: 7 mg/dL — AB (ref 8.9–10.3)
CO2: 20 mmol/L — ABNORMAL LOW (ref 22–32)
Chloride: 107 mmol/L (ref 98–111)
Creatinine, Ser: 0.84 mg/dL (ref 0.44–1.00)
GFR calc Af Amer: >60 mL/min (ref >60)
GLUCOSE: 108 mg/dL — AB (ref 70–99)
POTASSIUM: 3.7 mmol/L (ref 3.5–5.1)
Sodium: 132 mmol/L — ABNORMAL LOW (ref 135–145)
TOTAL PROTEIN: 6.7 g/dL (ref 6.5–8.1)

## 2018-03-05 LAB — RPR: RPR Ser Ql: NONREACTIVE

## 2018-03-05 LAB — RESPIRATORY PANEL BY PCR
ADENOVIRUS-RVPPCR: NOT DETECTED
Bordetella pertussis: NOT DETECTED
CORONAVIRUS NL63-RVPPCR: NOT DETECTED
CORONAVIRUS OC43-RVPPCR: NOT DETECTED
Chlamydophila pneumoniae: NOT DETECTED
Coronavirus 229E: NOT DETECTED
Coronavirus HKU1: NOT DETECTED
INFLUENZA A-RVPPCR: NOT DETECTED
INFLUENZA B-RVPPCR: NOT DETECTED
METAPNEUMOVIRUS-RVPPCR: NOT DETECTED
Mycoplasma pneumoniae: NOT DETECTED
PARAINFLUENZA VIRUS 1-RVPPCR: NOT DETECTED
PARAINFLUENZA VIRUS 2-RVPPCR: NOT DETECTED
PARAINFLUENZA VIRUS 3-RVPPCR: NOT DETECTED
PARAINFLUENZA VIRUS 4-RVPPCR: NOT DETECTED
RHINOVIRUS / ENTEROVIRUS - RVPPCR: NOT DETECTED
Respiratory Syncytial Virus: NOT DETECTED

## 2018-03-05 LAB — C DIFFICILE QUICK SCREEN W PCR REFLEX
C DIFFICILE (CDIFF) INTERP: NOT DETECTED
C Diff antigen: NEGATIVE
C Diff toxin: NEGATIVE

## 2018-03-05 LAB — PHOSPHORUS: Phosphorus: 1.4 mg/dL — ABNORMAL LOW (ref 2.5–4.6)

## 2018-03-05 LAB — MAGNESIUM: Magnesium: 1.9 mg/dL (ref 1.7–2.4)

## 2018-03-05 MED ORDER — K PHOS MONO-SOD PHOS DI & MONO 155-852-130 MG PO TABS
500.0000 mg | ORAL_TABLET | Freq: Three times a day (TID) | ORAL | Status: AC
  Administered 2018-03-05 (×3): 500 mg via ORAL
  Filled 2018-03-05 (×3): qty 2

## 2018-03-05 NOTE — Progress Notes (Signed)
Dr. Olevia Bowens notified of critical value, WBC 0.8.  No new orders received.

## 2018-03-05 NOTE — Progress Notes (Signed)
PROGRESS NOTE  Amy Douglas KVQ:[id number] DOB: 1970-08-06 DOA: 03/04/2018 PCP: [name], MD  Brief History:  47 y.o. female with medical history of invasive ductal carcinoma the right breast, migraine headache, anxiety/depression, and chronic pain syndrome presenting with fever, rash, and arthralgias that began on 02/28/2018.  The patient actually began developing a fever up to 101.9 F on 02/28/2018.  She went to the emergency department on 03/02/2018 with the above symptoms.  Blood cultures, urine cultures, and group A streptococcus PCR was performed which have been negative to this point.  Chest x-ray on that day suggested a possible retrocardiac opacity.  The patient was sent home with levofloxacin.  She took 1 dose of levofloxacin on 03/03/2018.  She continued to have fevers.  The patient subsequently developed a maculopapular erythematous rash on her face, trunk, arms, and legs that she noticed on the morning of 03/04/2018.  As result, she presented to emergency department for further evaluation.  Notably, the patient was also recently started on lamotrigine for possible seizure on 02/15/2018.  She has been on a gradual titration and is currently taking 50 mg twice daily.  The patient states that she has been exposed to a coworker who had high fevers this past week.  She denies any neck pain, visual disturbance, chest pain, shortness breath, coughing, coryza, eye pain/conjunctivitis, hemoptysis, nausea, vomiting, diarrhea, abdominal pain, dysuria, hematuria, hematochezia, melena.  She denies any frank synovitis but has arthralgias and myalgias.  She has been taking Goody's for her fever and arthralgias.  She has not been started on any other additional medications other than discussed above.  She denies any recent camping trips, hiking, fishing.  She has 2 dogs at home but denies any other exotic pets.  She has not had any travels outside in [place] in the past month. In the emergency  department, the patient was febrile up to 102.9 F with mild tachycardia, but she was hemodynamically stable saturating 100% on room air.  WBC was 0.5 with hemoglobin 11.3 and platelets 106,000.  BMP showed a sodium 131.  LFTs were essentially unremarkable.  Lactic acid was 1.11.  Chest x-ray did not show consolidation.  Urinalysis was negative for pyuria.  The patient was admitted for further work-up and treatment  Assessment/Plan: Fever/pancytopenia/rash -Concerned about overwhelming sepsis -Differential diagnosis is broad including possible tickborne illness, viral exanthem, and medication effect and acute leukemia (less likely) -continue empiric vancomycin, aztreonam -Follow blood and urine cultures--neg to date -Ehrlichia and RMSF serologies -Parvovirus serologies and PCR -RPR--neg -EBV DNA -CMV DNA -Trend procalcitonin -Continue empiric doxycycline -CBC with differential -Discontinue levofloxacin and lamotrigine--patient was febrile and leukopenic even prior to starting levofloxacin -Urinalysis negative for pyuria -still have fevers up to 101.1 -Continue IV fluids -check B12 and folate  Migraine headaches -Continue propranolol  Depression/anxiety -Continue Zoloft  Hypophosphatemia -replete  Diarrhea -check cdiff    Disposition Plan:   Home in 2-3 days  Family Communication:  No Family at bedside  Consultants:  none  Code Status:  FULL   DVT Prophylaxis:  SCDs   Procedures: As Listed in Progress Note Above  Antibiotics: Doxy 9/1>>> vanco 9/1>>> Aztreonam 91/>>> Metronidazole 91/>>>9/2    Subjective: Pt still with arthralgias but slowly improving.  Still having f/c.  No cough, coryza, eye drainage.  No sore throat.  No cp, sob, n/v/d, abd pain. No dysuria  Objective: Vitals:   03/05/18 0600 03/05/18 0700 03/05/18 0800 03/05/18 0831  BP:      Pulse:  93 90   Resp:  15 15   Temp: (!) 101.1 F (38.4 C)   99.3 F (37.4 C)  TempSrc: Oral    Axillary  SpO2:      Weight:      Height:        Intake/Output Summary (Last 24 hours) at 03/05/2018 0908 Last data filed at 03/04/2018 2100 Gross per 24 hour  Intake 4408.34 ml  Output -  Net 4408.34 ml   Weight change:  Exam:   General:  Pt is alert, follows commands appropriately, not in acute distress  HEENT: No icterus, No thrush, No neck mass, [place]/AT  Cardiovascular: RRR, S1/S2, no rubs, no gallops  Respiratory: CTA bilaterally, no wheezing, no crackles, no rhonchi  Abdomen: Soft/+BS, non tender, non distended, no guarding  Extremities: No edema, No lymphangitis, No petechiae, diffuse erythematous rash on face, neck trunk arms and legs, no synovitis   Data Reviewed: I have personally reviewed following labs and imaging studies Basic Metabolic Panel: Recent Labs  Lab 03/02/18 1338 03/04/18 1032 03/04/18 1546 03/04/18 2001 03/05/18 0359  NA 132* 131* 134*  --  132*  K 3.9 3.6 3.2*  --  3.7  CL 104 102 111  --  107  CO2 23 22 19*  --  20*  GLUCOSE 99 102* 102*  --  108*  BUN 9 7 6   --  6  CREATININE 1.10* 0.92 0.81  --  0.84  CALCIUM 8.2* 7.9* 6.7*  --  7.0*  MG  --   --   --  1.9  --   PHOS  --   --   --  1.4*  --    Liver Function Tests: Recent Labs  Lab 03/02/18 1338 03/04/18 1032 03/04/18 1546 03/05/18 0359  AST 24 34 28 34  ALT 18 24 20 24   ALKPHOS 53 46 39 36*  BILITOT 0.5 0.7 1.3* 0.6  PROT 8.3* 8.0 6.6 6.7  ALBUMIN 3.3* 3.1* 2.6* 2.6*   No results for input(s): LIPASE, AMYLASE in the last 168 hours. No results for input(s): AMMONIA in the last 168 hours. Coagulation Profile: Recent Labs  Lab 03/04/18 1546  INR 1.21   CBC: Recent Labs  Lab 03/02/18 1338 03/04/18 1032 03/04/18 1546 03/05/18 0359  WBC 2.5* 0.5* 0.6* 0.8*  NEUTROABS 1.6* 0.1*  --  0.1  HGB 11.8* 11.3* 9.7* 9.8*  HCT 35.2* 33.6* 28.8* 29.2*  MCV 90.5 88.4 88.3 88.8  PLT 123* 106* 93* 128*   Cardiac Enzymes: No results for input(s): CKTOTAL, CKMB, CKMBINDEX,  TROPONINI in the last 168 hours. BNP: Invalid input(s): POCBNP CBG: No results for input(s): GLUCAP in the last 168 hours. HbA1C: No results for input(s): HGBA1C in the last 72 hours. Urine analysis:    Component Value Date/Time   COLORURINE YELLOW 03/04/2018 1046   APPEARANCEUR HAZY (A) 03/04/2018 1046   LABSPEC 1.021 03/04/2018 1046   PHURINE 5.0 03/04/2018 1046   GLUCOSEU NEGATIVE 03/04/2018 1046   HGBUR SMALL (A) 03/04/2018 1046   BILIRUBINUR NEGATIVE 03/04/2018 1046   KETONESUR 20 (A) 03/04/2018 1046   PROTEINUR NEGATIVE 03/04/2018 1046   NITRITE NEGATIVE 03/04/2018 1046   LEUKOCYTESUR NEGATIVE 03/04/2018 1046   Sepsis Labs: @LABRCNTIP (procalcitonin:4,lacticidven:4) ) Recent Results (from the past 240 hour(s))  Group A Strep by PCR     Status: None   Collection Time: 03/02/18  1:09 PM  Result Value Ref Range Status   Group A  Strep by PCR NOT DETECTED NOT DETECTED Final    Comment: Performed at [facility], [address], [place], [place] [address]  Urine Culture     Status: None   Collection Time: 03/02/18  1:09 PM  Result Value Ref Range Status   Specimen Description   Final    URINE, CLEAN CATCH Performed at [facility], [address], [place], [place] [address]    Special Requests   Final    NONE Performed at [facility], [address], [place], [place] [address]    Culture   Final    NO GROWTH Performed at [facility], [place] [address], [place], [place] [address]    Report Status 03/03/2018 FINAL  Final  Blood Culture (routine x 2)     Status: None (Preliminary result)   Collection Time: 03/02/18  1:45 PM  Result Value Ref Range Status   Specimen Description   Final    LEFT ANTECUBITAL BOTTLES DRAWN AEROBIC AND ANAEROBIC   Special Requests Blood Culture adequate volume  Final   Culture   Final    NO GROWTH 2 DAYS Performed at [facility], [address], [place], [place] [address]    Report Status PENDING  Incomplete  Blood Culture (routine x  2)     Status: None (Preliminary result)   Collection Time: 03/02/18  1:47 PM  Result Value Ref Range Status   Specimen Description   Final    BLOOD LEFT HAND BOTTLES DRAWN AEROBIC AND ANAEROBIC   Special Requests   Final    Blood Culture results may not be optimal due to an inadequate volume of blood received in culture bottles   Culture   Final    NO GROWTH 2 DAYS Performed at [facility], [address] [place], [place] [address]    Report Status PENDING  Incomplete  Blood Culture (routine x 2)     Status: None (Preliminary result)   Collection Time: 03/04/18 10:16 AM  Result Value Ref Range Status   Specimen Description BLOOD LEFT HAND  Final   Special Requests   Final    BOTTLES DRAWN AEROBIC AND ANAEROBIC Blood Culture adequate volume Performed at [facility], [address] [place], [place] [address]    Culture PENDING  Incomplete   Report Status PENDING  Incomplete  Blood Culture (routine x 2)     Status: None (Preliminary result)   Collection Time: 03/04/18 10:32 AM  Result Value Ref Range Status   Specimen Description LEFT ANTECUBITAL  Final   Special Requests   Final    Blood Culture adequate volume BOTTLES DRAWN AEROBIC AND ANAEROBIC Performed at [facility], [address], [place], [place] [address]    Culture PENDING  Incomplete   Report Status PENDING  Incomplete  MRSA PCR Screening     Status: None   Collection Time: 03/04/18  2:00 PM  Result Value Ref Range Status   MRSA by PCR NEGATIVE NEGATIVE Final    Comment:        The GeneXpert MRSA Assay (FDA approved for NASAL specimens only), is one component of a comprehensive MRSA colonization surveillance program. It is not intended to diagnose MRSA infection nor to guide or monitor treatment for MRSA infections. Performed at [facility], [address], [place],  [address]      Scheduled Meds: . doxycycline  100 mg Oral Q12H  . propranolol ER  60 mg Oral Daily  . sertraline  100 mg  Oral  Daily   Continuous Infusions: . 0.9 % NaCl with KCl 20 mEq / L 100 mL/hr at 03/04/18 1505  . aztreonam Stopped (03/05/18 0550)  . metronidazole Stopped (03/05/18 0151)  . vancomycin Stopped (03/05/18 0005)    Procedures/Studies: Dg Chest 2 View  Result Date: 03/04/2018 CLINICAL DATA:  Cough, fever, recent pneumonia EXAM: CHEST - 2 VIEW COMPARISON:  03/02/2018 FINDINGS: The heart size and mediastinal contours are within normal limits. Both lungs are clear. The visualized skeletal structures are unremarkable. Trachea is midline. No large effusion or pneumothorax. Previous lower cervical fusion. Chronic osseous changes over the right upper chest. IMPRESSION: No active cardiopulmonary disease. Electronically Signed   By: Jerilynn Mages.  Shick M.D.   On: 03/04/2018 11:10   Dg Chest 2 View  Result Date: 03/02/2018 CLINICAL DATA:  Fever and body aches for the past 2 days. EXAM: CHEST - 2 VIEW COMPARISON:  None. FINDINGS: The heart size and mediastinal contours are within normal limits. Normal pulmonary vascularity. Subtle increased patchy density in the retrocardiac left lower lobe. No pleural effusion or pneumothorax. Lower cervical ACDF. No acute osseous abnormality. IMPRESSION: Mild patchy density in the retrocardiac left lower lobe, suspicious for pneumonia. Electronically Signed   By: Titus Dubin M.D.   On: 03/02/2018 12:59    Orson Eva, DO  [facility] Pager [phone]  If 7PM-7AM, please contact night-coverage [email or web address] Password [email or web address] 03/05/2018, 9:08 AM   LOS: 1 day

## 2018-03-06 LAB — COMPREHENSIVE METABOLIC PANEL
ALK PHOS: 39 U/L (ref 38–126)
ALT: 32 U/L (ref 0–44)
AST: 38 U/L (ref 15–41)
Albumin: 2.9 g/dL — ABNORMAL LOW (ref 3.5–5.0)
Anion gap: 6 (ref 5–15)
BUN: 5 mg/dL — ABNORMAL LOW (ref 6–20)
CALCIUM: 7.7 mg/dL — AB (ref 8.9–10.3)
CO2: 21 mmol/L — ABNORMAL LOW (ref 22–32)
CREATININE: 0.73 mg/dL (ref 0.44–1.00)
Chloride: 107 mmol/L (ref 98–111)
Glucose, Bld: 100 mg/dL — ABNORMAL HIGH (ref 70–99)
Potassium: 4 mmol/L (ref 3.5–5.1)
Sodium: 134 mmol/L — ABNORMAL LOW (ref 135–145)
Total Bilirubin: 0.5 mg/dL (ref 0.3–1.2)
Total Protein: 7.8 g/dL (ref 6.5–8.1)

## 2018-03-06 LAB — CBC WITH DIFFERENTIAL/PLATELET
BASOS ABS: 0 10*3/uL (ref 0.0–0.1)
Basophils Relative: 2 %
Eosinophils Absolute: 0.1 10*3/uL (ref 0.0–0.7)
Eosinophils Relative: 10 %
HEMATOCRIT: 31.6 % — AB (ref 36.0–46.0)
Hemoglobin: 10.5 g/dL — ABNORMAL LOW (ref 12.0–15.0)
Lymphocytes Relative: 46 %
Lymphs Abs: 0.6 10*3/uL — ABNORMAL LOW (ref 0.7–4.0)
MCH: 29.6 pg (ref 26.0–34.0)
MCHC: 33.2 g/dL (ref 30.0–36.0)
MCV: 89 fL (ref 78.0–100.0)
MONO ABS: 0.3 10*3/uL (ref 0.1–1.0)
MONOS PCT: 28 %
Neutro Abs: 0.2 10*3/uL — ABNORMAL LOW (ref 1.7–7.7)
Neutrophils Relative %: 14 %
Platelets: 174 10*3/uL (ref 150–400)
RBC: 3.55 MIL/uL — ABNORMAL LOW (ref 3.87–5.11)
RDW: 13.6 % (ref 11.5–15.5)
WBC: 1.2 10*3/uL — CL (ref 4.0–10.5)

## 2018-03-06 LAB — EPSTEIN BARR VRS(EBV DNA BY PCR)
EBV DNA QN BY PCR: NEGATIVE {copies}/mL
log10 EBV DNA Qn PCR: UNDETERMINED log10 copy/mL

## 2018-03-06 LAB — VITAMIN B12: VITAMIN B 12: 230 pg/mL (ref 180–914)

## 2018-03-06 LAB — B. BURGDORFI ANTIBODIES: B burgdorferi Ab IgG+IgM: <0.91 {ISR} (ref 0.00–0.90)

## 2018-03-06 LAB — PHOSPHORUS: PHOSPHORUS: 1.6 mg/dL — AB (ref 2.5–4.6)

## 2018-03-06 LAB — HIV ANTIBODY (ROUTINE TESTING W REFLEX): HIV SCREEN 4TH GENERATION: NONREACTIVE

## 2018-03-06 LAB — MAGNESIUM: Magnesium: 2 mg/dL (ref 1.7–2.4)

## 2018-03-06 LAB — FOLATE: Folate: 9.6 ng/mL (ref >5.9)

## 2018-03-06 LAB — PATHOLOGIST SMEAR REVIEW

## 2018-03-06 MED ORDER — VITAMIN B-12 100 MCG PO TABS
500.0000 ug | ORAL_TABLET | Freq: Every day | ORAL | Status: DC
  Administered 2018-03-07: 500 ug via ORAL
  Filled 2018-03-06: qty 5

## 2018-03-06 MED ORDER — POTASSIUM PHOSPHATE MONOBASIC 500 MG PO TABS
500.0000 mg | ORAL_TABLET | Freq: Three times a day (TID) | ORAL | Status: DC
  Filled 2018-03-06 (×5): qty 1

## 2018-03-06 MED ORDER — CYANOCOBALAMIN 1000 MCG/ML IJ SOLN
1000.0000 ug | Freq: Once | INTRAMUSCULAR | Status: AC
  Administered 2018-03-06: 1000 ug via INTRAMUSCULAR
  Filled 2018-03-06: qty 1

## 2018-03-06 MED ORDER — K PHOS MONO-SOD PHOS DI & MONO 155-852-130 MG PO TABS
500.0000 mg | ORAL_TABLET | Freq: Three times a day (TID) | ORAL | Status: AC
  Administered 2018-03-06 – 2018-03-07 (×5): 500 mg via ORAL
  Filled 2018-03-06 (×5): qty 2

## 2018-03-06 NOTE — Progress Notes (Signed)
PROGRESS NOTE  Amy Douglas DEY:[id number] DOB: [birth year] DOA: 03/04/2018 PCP: [name], MD  Brief History:  47 y.o.femalewith medical history ofinvasive ductal carcinoma the right breast, migraine headache, anxiety/depression, and chronic pain syndrome presenting with fever, rash, and arthralgias that began on 02/28/2018. The patient actually began developing a fever up to 101.9 F on 02/28/2018. She went to the emergency department on 03/02/2018 with the above symptoms. Blood cultures, urine cultures, and group A streptococcus PCR was performed which have been negative to this point. Chest x-ray on that day suggested a possible retrocardiac opacity. The patient was sent home with levofloxacin. She took 1 dose of levofloxacin on 03/03/2018. She continued to have fevers. The patient subsequently developed a maculopapular erythematous rash on her face, trunk, arms, and legs that she noticed on the morning of 03/04/2018. As result, she presented to emergency department for further evaluation. Notably, the patient was also recently started on lamotrigine for possible seizure on 02/15/2018. She has been on a gradual titration and is currently taking 50 mg twice daily. The patient states that she has been exposed to a coworker who had high fevers this past week. She denies any neck pain, visual disturbance, chest pain, shortness breath, coughing, coryza, eye pain/conjunctivitis, hemoptysis, nausea, vomiting, diarrhea, abdominal pain, dysuria, hematuria, hematochezia, melena. She denies any frank synovitis but has arthralgias and myalgias. She has been taking Goody's forher fever and arthralgias. She has not been started on any other additional medications other than discussed above. She denies any recent camping trips, hiking, fishing. She has 2 dogs at home but denies any other exotic pets. She has not had any travels outside in [place] in the past month. In the emergency  department, the patient was febrile up to 102.9 F with mild tachycardia, but she was hemodynamically stable saturating 100% on room air. WBC was 0.5 with hemoglobin 11.3 and platelets 106,000. BMP showed a sodium 131. LFTs were essentially unremarkable. Lactic acid was 1.11. Chest x-ray did not show consolidation. Urinalysis was negative for pyuria. The patient was admitted for further work-up and treatment.  She was initially started on vanco, aztreonam, metronidazole and doxy.  Assessment/Plan: Fever/pancytopenia/rash -Differential diagnosis is broad including possible tickborne illness, viral exanthem, and medication effect and acute leukemia(less likely) -discontinue empiric vancomycin, aztreonam -Follow blood and urine cultures--neg to date -Ehrlichia and RMSF serologies--pending -Parvovirus serologies and PCR--pending -RPR--neg -EBV DNA--pending -CMV DNA--pending -Trend procalcitonin--<0.10 -Continue empiric doxycycline -CBC with differential -Discontinue levofloxacin and lamotrigine--patient was febrile and leukopenic even prior to starting levofloxacin -Urinalysis negative for pyuria -now afebrile x 24 hours -Continue IV fluids -check B12--230-->supplement  -folate--9.6  Migraine headaches -Continue propranolol  Depression/anxiety -Continue Zoloft  Hypophosphatemia -replete  Diarrhea -check cdiff    Disposition Plan:   Home 9/4 if stable Family Communication:  No Family at bedside  Consultants:  none  Code Status:  FULL   DVT Prophylaxis:  SCDs   Procedures: As Listed in Progress Note Above  Antibiotics: Doxy 9/1>>> vanco 9/1>>>9/3 Aztreonam 91/>>>9/3 Metronidazole 91/>>>9/2    Subjective: Patient denies fevers, chills, headache, chest pain, dyspnea, nausea, vomiting, diarrhea, abdominal pain, dysuria, hematuria, hematochezia, and melena.    Objective: Vitals:   03/05/18 0831 03/05/18 1200 03/05/18 2130 03/06/18 0311  BP:    (!) 142/67 (!) 149/64  Pulse:   88 83  Resp:   16 17  Temp: 99.3 F (37.4 C) 98.8 F (37.1 C) 98.8 F (37.1 C) 99.1  F (37.3 C)  TempSrc: Axillary Oral Oral Oral  SpO2:   99% 100%  Weight:      Height:        Intake/Output Summary (Last 24 hours) at 03/06/2018 0945 Last data filed at 03/06/2018 0900 Gross per 24 hour  Intake 3368.33 ml  Output -  Net 3368.33 ml   Weight change:  Exam:   General:  Pt is alert, follows commands appropriately, not in acute distress  HEENT: No icterus, No thrush, No neck mass, [place]/AT  Cardiovascular: RRR, S1/S2, no rubs, no gallops  Respiratory: CTA bilaterally, no wheezing, no crackles, no rhonchi  Abdomen: Soft/+BS, non tender, non distended, no guarding  Extremities: No edema, No lymphangitis, No petechiae, No rashes, no synovitis   Data Reviewed: I have personally reviewed following labs and imaging studies Basic Metabolic Panel: Recent Labs  Lab 03/02/18 1338 03/04/18 1032 03/04/18 1546 03/04/18 2001 03/05/18 0359 03/06/18 0645  NA 132* 131* 134*  --  132* 134*  K 3.9 3.6 3.2*  --  3.7 4.0  CL 104 102 111  --  107 107  CO2 23 22 19*  --  20* 21*  GLUCOSE 99 102* 102*  --  108* 100*  BUN 9 7 6   --  6 5*  CREATININE 1.10* 0.92 0.81  --  0.84 0.73  CALCIUM 8.2* 7.9* 6.7*  --  7.0* 7.7*  MG  --   --   --  1.9  --  2.0  PHOS  --   --   --  1.4*  --  1.6*   Liver Function Tests: Recent Labs  Lab 03/02/18 1338 03/04/18 1032 03/04/18 1546 03/05/18 0359 03/06/18 0645  AST 24 34 28 34 38  ALT 18 24 20 24  32  ALKPHOS 53 46 39 36* 39  BILITOT 0.5 0.7 1.3* 0.6 0.5  PROT 8.3* 8.0 6.6 6.7 7.8  ALBUMIN 3.3* 3.1* 2.6* 2.6* 2.9*   No results for input(s): LIPASE, AMYLASE in the last 168 hours. No results for input(s): AMMONIA in the last 168 hours. Coagulation Profile: Recent Labs  Lab 03/04/18 1546  INR 1.21   CBC: Recent Labs  Lab 03/02/18 1338 03/04/18 1032 03/04/18 1546 03/05/18 0359 03/06/18 0645  WBC 2.5*  0.5* 0.6* 0.8* 1.2*  NEUTROABS 1.6* 0.1*  --  0.1 0.2*  HGB 11.8* 11.3* 9.7* 9.8* 10.5*  HCT 35.2* 33.6* 28.8* 29.2* 31.6*  MCV 90.5 88.4 88.3 88.8 89.0  PLT 123* 106* 93* 128* 174   Cardiac Enzymes: No results for input(s): CKTOTAL, CKMB, CKMBINDEX, TROPONINI in the last 168 hours. BNP: Invalid input(s): POCBNP CBG: No results for input(s): GLUCAP in the last 168 hours. HbA1C: No results for input(s): HGBA1C in the last 72 hours. Urine analysis:    Component Value Date/Time   COLORURINE YELLOW 03/04/2018 1046   APPEARANCEUR HAZY (A) 03/04/2018 1046   LABSPEC 1.021 03/04/2018 1046   PHURINE 5.0 03/04/2018 1046   GLUCOSEU NEGATIVE 03/04/2018 1046   HGBUR SMALL (A) 03/04/2018 1046   BILIRUBINUR NEGATIVE 03/04/2018 1046   KETONESUR 20 (A) 03/04/2018 1046   PROTEINUR NEGATIVE 03/04/2018 1046   NITRITE NEGATIVE 03/04/2018 1046   LEUKOCYTESUR NEGATIVE 03/04/2018 1046   Sepsis Labs: @LABRCNTIP (procalcitonin:4,lacticidven:4) ) Recent Results (from the past 240 hour(s))  Group A Strep by PCR     Status: None   Collection Time: 03/02/18  1:09 PM  Result Value Ref Range Status   Group A Strep by PCR NOT DETECTED NOT DETECTED Final  Comment: Performed at [facility], [address], [place], [place] [address]  Urine Culture     Status: None   Collection Time: 03/02/18  1:09 PM  Result Value Ref Range Status   Specimen Description   Final    URINE, CLEAN CATCH Performed at [facility], [address] [place], [place] [address]    Special Requests   Final    NONE Performed at [facility], [address], [place], [place] [address]    Culture   Final    NO GROWTH Performed at [facility], [place] [address], [place], [place] [address]    Report Status 03/03/2018 FINAL  Final  Blood Culture (routine x 2)     Status: None (Preliminary result)   Collection Time: 03/02/18  1:45 PM  Result Value Ref Range Status   Specimen Description   Final    LEFT ANTECUBITAL BOTTLES  DRAWN AEROBIC AND ANAEROBIC   Special Requests Blood Culture adequate volume  Final   Culture   Final    NO GROWTH 4 DAYS Performed at [facility], [address], [place], [place] [address]    Report Status PENDING  Incomplete  Blood Culture (routine x 2)     Status: None (Preliminary result)   Collection Time: 03/02/18  1:47 PM  Result Value Ref Range Status   Specimen Description   Final    BLOOD LEFT HAND BOTTLES DRAWN AEROBIC AND ANAEROBIC   Special Requests   Final    Blood Culture results may not be optimal due to an inadequate volume of blood received in culture bottles   Culture   Final    NO GROWTH 4 DAYS Performed at [facility], [address], [place], [place] [address]    Report Status PENDING  Incomplete  Blood Culture (routine x 2)     Status: None (Preliminary result)   Collection Time: 03/04/18 10:16 AM  Result Value Ref Range Status   Specimen Description BLOOD LEFT HAND  Final   Special Requests   Final    BOTTLES DRAWN AEROBIC AND ANAEROBIC Blood Culture adequate volume   Culture   Final    NO GROWTH 2 DAYS Performed at [facility], [address], [place], [place] [address]    Report Status PENDING  Incomplete  Blood Culture (routine x 2)     Status: None (Preliminary result)   Collection Time: 03/04/18 10:32 AM  Result Value Ref Range Status   Specimen Description LEFT ANTECUBITAL  Final   Special Requests   Final    Blood Culture adequate volume BOTTLES DRAWN AEROBIC AND ANAEROBIC   Culture   Final    NO GROWTH 2 DAYS Performed at [facility], [address] [place],  [address]    Report Status PENDING  Incomplete  MRSA PCR Screening     Status: None   Collection Time: 03/04/18  2:00 PM  Result Value Ref Range Status   MRSA by PCR NEGATIVE NEGATIVE Final    Comment:        The GeneXpert MRSA Assay (FDA approved for NASAL specimens only), is one component of a comprehensive MRSA colonization surveillance program. It is not intended to  diagnose MRSA infection nor to guide or monitor treatment for MRSA infections. Performed at [facility], [address] [place],  [address]   Respiratory Panel by PCR     Status: None   Collection Time: 03/04/18  2:00 PM  Result Value Ref Range  Status   Adenovirus NOT DETECTED NOT DETECTED Final   Coronavirus 229E NOT DETECTED NOT DETECTED Final   Coronavirus HKU1 NOT DETECTED NOT DETECTED Final   Coronavirus NL63 NOT DETECTED NOT DETECTED Final   Coronavirus OC43 NOT DETECTED NOT DETECTED Final   Metapneumovirus NOT DETECTED NOT DETECTED Final   Rhinovirus / Enterovirus NOT DETECTED NOT DETECTED Final   Influenza A NOT DETECTED NOT DETECTED Final   Influenza B NOT DETECTED NOT DETECTED Final   Parainfluenza Virus 1 NOT DETECTED NOT DETECTED Final   Parainfluenza Virus 2 NOT DETECTED NOT DETECTED Final   Parainfluenza Virus 3 NOT DETECTED NOT DETECTED Final   Parainfluenza Virus 4 NOT DETECTED NOT DETECTED Final   Respiratory Syncytial Virus NOT DETECTED NOT DETECTED Final   Bordetella pertussis NOT DETECTED NOT DETECTED Final   Chlamydophila pneumoniae NOT DETECTED NOT DETECTED Final   Mycoplasma pneumoniae NOT DETECTED NOT DETECTED Final    Comment: Performed at [facility], [place] [address] [place], [place] [address]  C difficile quick scan w PCR reflex     Status: None   Collection Time: 03/05/18  9:53 AM  Result Value Ref Range Status   C Diff antigen NEGATIVE NEGATIVE Final   C Diff toxin NEGATIVE NEGATIVE Final   C Diff interpretation No C. difficile detected.  Final    Comment: Performed at [facility], [address], [place], [place] [address]     Scheduled Meds: . cyanocobalamin  1,000 mcg Intramuscular Once  . doxycycline  100 mg Oral Q12H  . propranolol ER  60 mg Oral Daily  . sertraline  100 mg Oral Daily  . [START ON 03/07/2018] vitamin B-12  500 mcg Oral Daily   Continuous Infusions: . 0.9 % NaCl with KCl 20 mEq / L 100 mL/hr at 03/05/18 2120     Procedures/Studies: Dg Chest 2 View  Result Date: 03/04/2018 CLINICAL DATA:  Cough, fever, recent pneumonia EXAM: CHEST - 2 VIEW COMPARISON:  03/02/2018 FINDINGS: The heart size and mediastinal contours are within normal limits. Both lungs are clear. The visualized skeletal structures are unremarkable. Trachea is midline. No large effusion or pneumothorax. Previous lower cervical fusion. Chronic osseous changes over the right upper chest. IMPRESSION: No active cardiopulmonary disease. Electronically Signed   By: Jerilynn Mages.  Shick M.D.   On: 03/04/2018 11:10   Dg Chest 2 View  Result Date: 03/02/2018 CLINICAL DATA:  Fever and body aches for the past 2 days. EXAM: CHEST - 2 VIEW COMPARISON:  None. FINDINGS: The heart size and mediastinal contours are within normal limits. Normal pulmonary vascularity. Subtle increased patchy density in the retrocardiac left lower lobe. No pleural effusion or pneumothorax. Lower cervical ACDF. No acute osseous abnormality. IMPRESSION: Mild patchy density in the retrocardiac left lower lobe, suspicious for pneumonia. Electronically Signed   By: Titus Dubin M.D.   On: 03/02/2018 12:59    Orson Eva, DO  [facility] Pager [phone]  If 7PM-7AM, please contact night-coverage [email or web address] Password [email or web address] 03/06/2018, 9:45 AM   LOS: 2 days

## 2018-03-07 ENCOUNTER — Encounter: Payer: Self-pay | Admitting: Family Medicine

## 2018-03-07 DIAGNOSIS — D72819 Decreased white blood cell count, unspecified: Secondary | ICD-10-CM

## 2018-03-07 DIAGNOSIS — D61818 Other pancytopenia: Principal | ICD-10-CM

## 2018-03-07 LAB — CBC WITH DIFFERENTIAL/PLATELET
Basophils Absolute: 0 K/uL (ref 0.0–0.1)
Basophils Relative: 1 %
Eosinophils Absolute: 0.1 K/uL (ref 0.0–0.7)
Eosinophils Relative: 3 %
HCT: 32.2 % — ABNORMAL LOW (ref 36.0–46.0)
Hemoglobin: 11 g/dL — ABNORMAL LOW (ref 12.0–15.0)
Lymphocytes Relative: 33 %
Lymphs Abs: 0.7 K/uL (ref 0.7–4.0)
MCH: 30.1 pg (ref 26.0–34.0)
MCHC: 34.2 g/dL (ref 30.0–36.0)
MCV: 88 fL (ref 78.0–100.0)
Monocytes Absolute: 0.7 K/uL (ref 0.1–1.0)
Monocytes Relative: 34 %
Neutro Abs: 0.6 K/uL — ABNORMAL LOW (ref 1.7–7.7)
Neutrophils Relative %: 29 %
Platelets: 217 K/uL (ref 150–400)
RBC: 3.66 MIL/uL — ABNORMAL LOW (ref 3.87–5.11)
RDW: 13.9 % (ref 11.5–15.5)
WBC: 2.1 K/uL — ABNORMAL LOW (ref 4.0–10.5)

## 2018-03-07 LAB — BASIC METABOLIC PANEL WITH GFR
Anion gap: 6 (ref 5–15)
BUN: 6 mg/dL (ref 6–20)
CO2: 25 mmol/L (ref 22–32)
Calcium: 8.1 mg/dL — ABNORMAL LOW (ref 8.9–10.3)
Chloride: 103 mmol/L (ref 98–111)
Creatinine, Ser: 0.68 mg/dL (ref 0.44–1.00)
GFR calc Af Amer: >60 mL/min (ref >60)
GFR calc non Af Amer: >60 mL/min (ref >60)
Glucose, Bld: 105 mg/dL — ABNORMAL HIGH (ref 70–99)
Potassium: 3.7 mmol/L (ref 3.5–5.1)
Sodium: 134 mmol/L — ABNORMAL LOW (ref 135–145)

## 2018-03-07 LAB — EHRLICHIA ANTIBODY PANEL
E chaffeensis (HGE) Ab, IgG: NEGATIVE
E chaffeensis (HGE) Ab, IgM: NEGATIVE
E. CHAFFEENSIS IGG AB: NEGATIVE
E. Chaffeensis (HME) IgM Titer: NEGATIVE

## 2018-03-07 LAB — CMV DNA, QUANTITATIVE, PCR
CMV DNA Quant: NEGATIVE IU/mL
LOG10 CMV QN DNA PL: UNDETERMINED {Log_IU}/mL

## 2018-03-07 LAB — CULTURE, BLOOD (ROUTINE X 2)
CULTURE: NO GROWTH
Culture: NO GROWTH
SPECIAL REQUESTS: ADEQUATE

## 2018-03-07 LAB — HUMAN PARVOVIRUS DNA DETECTION BY PCR: Parvovirus B19, PCR: NEGATIVE

## 2018-03-07 LAB — ROCKY MTN SPOTTED FVR ABS PNL(IGG+IGM)
RMSF IGG: NEGATIVE
RMSF IgM: 0.33 index (ref 0.00–0.89)

## 2018-03-07 LAB — PHOSPHORUS: Phosphorus: 2.7 mg/dL (ref 2.5–4.6)

## 2018-03-07 MED ORDER — CYANOCOBALAMIN 500 MCG PO TABS: 500.0000 ug | ORAL_TABLET | Freq: Every day | ORAL | Status: DC

## 2018-03-07 MED ORDER — DOXYCYCLINE HYCLATE 100 MG PO TABS: 100.0000 mg | ORAL_TABLET | Freq: Two times a day (BID) | ORAL | 0 refills | Status: AC

## 2018-03-07 NOTE — Progress Notes (Signed)
IV removed, 2x2 gauze and paper tape applied to site, patient tolerated well.  AVS reviewed with patient who verbalized understanding.  Patient awaiting husband to transport home.

## 2018-03-07 NOTE — Discharge Summary (Signed)
Physician Discharge Summary  Amy Douglas XAJ:[id number] DOB: 12/08/1970 DOA: 03/04/2018  PCP: [name], MD  Admit date: 03/04/2018 Discharge date: 03/07/2018  Time spent: 45 minutes  Recommendations for Outpatient Follow-up:  -Will be discharged home today. -On discharge has 6 days of doxycycline remaining. -Advise follow-up with PCP in 2 weeks, at that time WBC count should be rechecked.  Discharge Diagnoses:  Active Problems:   Carcinoma of right breast ([place])   Chronic pain syndrome   Sepsis due to undetermined organism ([place])   Pancytopenia ([place])   Hypophosphatemia   Discharge Condition: Stable and improved  Filed Weights   03/04/18 0933 03/04/18 1444 03/05/18 0454  Weight: 98.4 kg 99.6 kg 100.6 kg    History of present illness:  As per Dr. Carles Collet on 9/1: Amy Douglas is a 47 y.o. female with medical history of invasive ductal carcinoma the right breast, migraine headache, anxiety/depression, and chronic pain syndrome presenting with fever, rash, and arthralgias that began on 02/28/2018.  The patient actually began developing a fever up to 101.9 F on 02/28/2018.  She went to the emergency department on 03/02/2018 with the above symptoms.  Blood cultures, urine cultures, and group A streptococcus PCR was performed which have been negative to this point.  Chest x-ray on that day suggested a possible retrocardiac opacity.  The patient was sent home with levofloxacin.  She took 1 dose of levofloxacin on 03/03/2018.  She continued to have fevers.  The patient subsequently developed a maculopapular erythematous rash on her face, trunk, arms, and legs that she noticed on the morning of 03/04/2018.  As result, she presented to emergency department for further evaluation.  Notably, the patient was also recently started on lamotrigine for possible seizure on 02/15/2018.  She has been on a gradual titration and is currently taking 50 mg twice daily.  The patient states that she has been  exposed to a coworker who had high fevers this past week.  She denies any neck pain, visual disturbance, chest pain, shortness breath, coughing, coryza, hemoptysis, nausea, vomiting, diarrhea, abdominal pain, dysuria, hematuria, hematochezia, melena.  She denies any frank synovitis but has arthralgias and myalgias.  She has been taking Goody's for her fever and arthralgias.  She has not been started on any other additional medications other than discussed above.  She denies any recent camping trips, hiking, fishing.  She has 2 dogs at home but denies any other exotic pets.  She has not had any travels outside in [place] in the past month. In the emergency department, the patient was febrile up to 102.9 F with mild tachycardia, but she was hemodynamically stable saturating 100% on room air.  WBC was 0.5 with hemoglobin 11.3 and platelets 106,000.  BMP showed a sodium 131.  LFTs were essentially unremarkable.  Lactic acid was 1.11.  Chest x-ray did not show consolidation.  Urinalysis was negative for pyuria.  The patient was admitted for further work-up and treatment.  Hospital Course:   Fever/pancytopenia/rash -Etiology remains unclear at discharge. -All culture data remains negative, rash has resolved, pancytopenia is improving, WBC count is 2.1 on discharge. -Some of the serology ordered during her hospitalization has returned and is negative such as RMSF, EBV, CMV, parvo C94. -Ehrlichia serology is pending on discharge, agree with continuing course of doxycycline for an additional 6 days on discharge to complete 10 days, although unlikely to be a tickborne illness.  Depression/anxiety -Continue Zoloft, mood has been stable throughout this hospitalization.  Procedures:  None   Consultations:  None  Discharge Instructions  Discharge Instructions    Diet - low sodium heart healthy   Complete by:  As directed    Increase activity slowly   Complete by:  As directed      Allergies  as of 03/07/2018      Reactions   Hydroxyzine Pamoate Itching   Amoxicillin-pot Clavulanate Rash   Augmentin    Latex Other (See Comments)   Rash and skin blistering   Other Other (See Comments)   States she has a bad reaction to all steroids: fever, insomnia, ams   Diclofenac    Upsets stomach   Levaquin [levofloxacin] Itching   Prednisone    AMS   Vistaril [hydroxyzine Hcl]    Lip Swelling       Medication List    STOP taking these medications   levofloxacin 750 MG tablet Commonly known as:  LEVAQUIN     TAKE these medications   acetaminophen 325 MG tablet Commonly known as:  TYLENOL Take 325-650 mg by mouth every 6 (six) hours as needed for mild pain.   doxycycline 100 MG tablet Commonly known as:  VIBRA-TABS Take 1 tablet (100 mg total) by mouth every 12 (twelve) hours for 6 days.   ibuprofen 200 MG tablet Commonly known as:  ADVIL,MOTRIN Take 200-400 mg by mouth every 6 (six) hours as needed for moderate pain.   lamoTRIgine 25 MG tablet Commonly known as:  LAMICTAL Take 25 mg bid for 1 week, followed by  2 weeks at 50 mg bid, than 75 mg bid po for 2 weeks, than 100 mg bid for 2 weeks and stay there. What changed:    how much to take  how to take this  when to take this   propranolol ER 60 MG 24 hr capsule Commonly known as:  INDERAL LA Take 1 capsule (60 mg total) by mouth daily.   sertraline 100 MG tablet Commonly known as:  ZOLOFT Take 1 tablet (100 mg total) by mouth daily.   vitamin B-12 500 MCG tablet Commonly known as:  CYANOCOBALAMIN Take 1 tablet (500 mcg total) by mouth daily. Start taking on:  03/08/2018      Allergies  Allergen Reactions  . Hydroxyzine Pamoate Itching  . Amoxicillin-Pot Clavulanate Rash    Augmentin   . Latex Other (See Comments)    Rash and skin blistering  . Other Other (See Comments)    States she has a bad reaction to all steroids: fever, insomnia, ams  . Diclofenac     Upsets stomach  . Levaquin  [Levofloxacin] Itching  . Prednisone     AMS  . Vistaril [Hydroxyzine Hcl]     Lip Swelling       The results of significant diagnostics from this hospitalization (including imaging, microbiology, ancillary and laboratory) are listed below for reference.    Significant Diagnostic Studies: Dg Chest 2 View  Result Date: 03/04/2018 CLINICAL DATA:  Cough, fever, recent pneumonia EXAM: CHEST - 2 VIEW COMPARISON:  03/02/2018 FINDINGS: The heart size and mediastinal contours are within normal limits. Both lungs are clear. The visualized skeletal structures are unremarkable. Trachea is midline. No large effusion or pneumothorax. Previous lower cervical fusion. Chronic osseous changes over the right upper chest. IMPRESSION: No active cardiopulmonary disease. Electronically Signed   By: Jerilynn Mages.  Shick M.D.   On: 03/04/2018 11:10   Dg Chest 2 View  Result Date: 03/02/2018 CLINICAL DATA:  Fever and body aches for  the past 2 days. EXAM: CHEST - 2 VIEW COMPARISON:  None. FINDINGS: The heart size and mediastinal contours are within normal limits. Normal pulmonary vascularity. Subtle increased patchy density in the retrocardiac left lower lobe. No pleural effusion or pneumothorax. Lower cervical ACDF. No acute osseous abnormality. IMPRESSION: Mild patchy density in the retrocardiac left lower lobe, suspicious for pneumonia. Electronically Signed   By: Titus Dubin M.D.   On: 03/02/2018 12:59    Microbiology: Recent Results (from the past 240 hour(s))  Group A Strep by PCR     Status: None   Collection Time: 03/02/18  1:09 PM  Result Value Ref Range Status   Group A Strep by PCR NOT DETECTED NOT DETECTED Final    Comment: Performed at [facility], [address] [place], [place] [address]  Urine Culture     Status: None   Collection Time: 03/02/18  1:09 PM  Result Value Ref Range Status   Specimen Description   Final    URINE, CLEAN CATCH Performed at [facility], [address], [place], [place]  [address]    Special Requests   Final    NONE Performed at [facility], [address], [place], [place] [address]    Culture   Final    NO GROWTH Performed at [facility], [place] [address], [place], [place] [address]    Report Status 03/03/2018 FINAL  Final  Blood Culture (routine x 2)     Status: None   Collection Time: 03/02/18  1:45 PM  Result Value Ref Range Status   Specimen Description   Final    LEFT ANTECUBITAL BOTTLES DRAWN AEROBIC AND ANAEROBIC   Special Requests Blood Culture adequate volume  Final   Culture   Final    NO GROWTH 5 DAYS Performed at [facility], [address], [place], [place] [address]    Report Status 03/07/2018 FINAL  Final  Blood Culture (routine x 2)     Status: None   Collection Time: 03/02/18  1:47 PM  Result Value Ref Range Status   Specimen Description   Final    BLOOD LEFT HAND BOTTLES DRAWN AEROBIC AND ANAEROBIC   Special Requests   Final    Blood Culture results may not be optimal due to an inadequate volume of blood received in culture bottles   Culture   Final    NO GROWTH 5 DAYS Performed at [facility], [address], [place], [place] [address]    Report Status 03/07/2018 FINAL  Final  Blood Culture (routine x 2)     Status: None (Preliminary result)   Collection Time: 03/04/18 10:16 AM  Result Value Ref Range Status   Specimen Description BLOOD LEFT HAND  Final   Special Requests   Final    BOTTLES DRAWN AEROBIC AND ANAEROBIC Blood Culture adequate volume   Culture   Final    NO GROWTH 3 DAYS Performed at [facility], [address], [place], [place] [address]    Report Status PENDING  Incomplete  Blood Culture (routine x 2)     Status: None (Preliminary result)   Collection Time: 03/04/18 10:32 AM  Result Value Ref Range Status   Specimen Description LEFT ANTECUBITAL  Final   Special Requests   Final    Blood Culture adequate volume BOTTLES DRAWN AEROBIC AND ANAEROBIC   Culture   Final    NO GROWTH 3 DAYS Performed  at [facility], [address], [place], [place]  [address]    Report Status PENDING  Incomplete  MRSA PCR Screening     Status: None   Collection Time: 03/04/18  2:00 PM  Result Value Ref Range Status   MRSA by PCR NEGATIVE NEGATIVE Final    Comment:        The GeneXpert MRSA Assay (FDA approved for NASAL specimens only), is one component of a comprehensive MRSA colonization surveillance program. It is not intended to diagnose MRSA infection nor to guide or monitor treatment for MRSA infections. Performed at [facility], [address] [place], [place] [address]   Respiratory Panel by PCR     Status: None   Collection Time: 03/04/18  2:00 PM  Result Value Ref Range Status   Adenovirus NOT DETECTED NOT DETECTED Final   Coronavirus 229E NOT DETECTED NOT DETECTED Final   Coronavirus HKU1 NOT DETECTED NOT DETECTED Final   Coronavirus NL63 NOT DETECTED NOT DETECTED Final   Coronavirus OC43 NOT DETECTED NOT DETECTED Final   Metapneumovirus NOT DETECTED NOT DETECTED Final   Rhinovirus / Enterovirus NOT DETECTED NOT DETECTED Final   Influenza A NOT DETECTED NOT DETECTED Final   Influenza B NOT DETECTED NOT DETECTED Final   Parainfluenza Virus 1 NOT DETECTED NOT DETECTED Final   Parainfluenza Virus 2 NOT DETECTED NOT DETECTED Final   Parainfluenza Virus 3 NOT DETECTED NOT DETECTED Final   Parainfluenza Virus 4 NOT DETECTED NOT DETECTED Final   Respiratory Syncytial Virus NOT DETECTED NOT DETECTED Final   Bordetella pertussis NOT DETECTED NOT DETECTED Final   Chlamydophila pneumoniae NOT DETECTED NOT DETECTED Final   Mycoplasma pneumoniae NOT DETECTED NOT DETECTED Final    Comment: Performed at [facility], [place] [address] [place], [place] [address]  C difficile quick scan w PCR reflex     Status: None   Collection Time: 03/05/18  9:53 AM  Result Value Ref Range Status   C Diff antigen NEGATIVE NEGATIVE Final   C Diff toxin NEGATIVE NEGATIVE Final   C Diff interpretation No  C. difficile detected.  Final    Comment: Performed at [facility], [address] [place], [place] [address]     Labs: Basic Metabolic Panel: Recent Labs  Lab 03/04/18 1032 03/04/18 1546 03/04/18 2001 03/05/18 0359 03/06/18 0645 03/07/18 0532  NA 131* 134*  --  132* 134* 134*  K 3.6 3.2*  --  3.7 4.0 3.7  CL 102 111  --  107 107 103  CO2 22 19*  --  20* 21* 25  GLUCOSE 102* 102*  --  108* 100* 105*  BUN 7 6  --  6 5* 6  CREATININE 0.92 0.81  --  0.84 0.73 0.68  CALCIUM 7.9* 6.7*  --  7.0* 7.7* 8.1*  MG  --   --  1.9  --  2.0  --   PHOS  --   --  1.4*  --  1.6* 2.7   Liver Function Tests: Recent Labs  Lab 03/02/18 1338 03/04/18 1032 03/04/18 1546 03/05/18 0359 03/06/18 0645  AST 24 34 28 34 38  ALT 18 24 20 24  32  ALKPHOS 53 46 39 36* 39  BILITOT 0.5 0.7 1.3* 0.6 0.5  PROT 8.3* 8.0 6.6 6.7 7.8  ALBUMIN 3.3* 3.1* 2.6* 2.6* 2.9*   No results for input(s): LIPASE, AMYLASE in the last 168 hours. No results for input(s): AMMONIA in the last 168 hours. CBC: Recent Labs  Lab 03/02/18 1338 03/04/18 1032 03/04/18 1546  03/05/18 0359 03/06/18 0645 03/07/18 0532  WBC 2.5* 0.5* 0.6* 0.8* 1.2* 2.1*  NEUTROABS 1.6* 0.1*  --  0.1 0.2* 0.6*  HGB 11.8* 11.3* 9.7* 9.8* 10.5* 11.0*  HCT 35.2* 33.6* 28.8* 29.2* 31.6* 32.2*  MCV 90.5 88.4 88.3 88.8 89.0 88.0  PLT 123* 106* 93* 128* 174 217   Cardiac Enzymes: No results for input(s): CKTOTAL, CKMB, CKMBINDEX, TROPONINI in the last 168 hours. BNP: BNP (last 3 results) No results for input(s): BNP in the last 8760 hours.  ProBNP (last 3 results) No results for input(s): PROBNP in the last 8760 hours.  CBG: No results for input(s): GLUCAP in the last 168 hours.     Signed:  Lelon Frohlich  [facility] Pager: [phone] 03/07/2018, 11:46 AM

## 2018-03-08 ENCOUNTER — Telehealth: Payer: Self-pay

## 2018-03-08 NOTE — Telephone Encounter (Signed)
Transition Care Management Follow-up Telephone Call   Date discharged? 03/07/18               How have you been since you were released from the hospital? feeling much better   Do you understand why you were in the hospital? White blood count was really low and had a fever of 103   Do you understand the discharge instructions? yes   Where were you discharged to? home   Items Reviewed:  Medications reviewed: yes  Allergies reviewed: yes  Dietary changes reviewed: yes  Referrals reviewed:  no new referrals made   Functional Questionnaire:   Activities of Daily Living (ADLs):  yes    Any transportation issues/concerns?: no   Any patient concerns? no   Confirmed importance and date/time of follow-up visits scheduled 03/16/18     Confirmed with patient if condition begins to worsen call PCP or go to the ER.  Patient was given the office number and encouraged to call back with question or concerns. Yes with verbal understanding

## 2018-03-09 LAB — CULTURE, BLOOD (ROUTINE X 2)
CULTURE: NO GROWTH
Culture: NO GROWTH
SPECIAL REQUESTS: ADEQUATE
Special Requests: ADEQUATE

## 2018-03-10 ENCOUNTER — Other Ambulatory Visit: Payer: BLUE CROSS/BLUE SHIELD

## 2018-03-14 ENCOUNTER — Telehealth: Payer: Self-pay | Admitting: Family Medicine

## 2018-03-14 DIAGNOSIS — E878 Other disorders of electrolyte and fluid balance, not elsewhere classified: Secondary | ICD-10-CM

## 2018-03-14 DIAGNOSIS — D619 Aplastic anemia, unspecified: Secondary | ICD-10-CM

## 2018-03-14 DIAGNOSIS — R3121 Asymptomatic microscopic hematuria: Secondary | ICD-10-CM

## 2018-03-14 NOTE — Telephone Encounter (Signed)
Requesting labs prior to her OV on Friday. Would like to get them done so that they will be ready for the visit. Records reviewed. Labs ordered. Patient informed.

## 2018-03-15 ENCOUNTER — Encounter: Payer: Self-pay | Admitting: Family Medicine

## 2018-03-15 LAB — COMPLETE METABOLIC PANEL WITH GFR
AG Ratio: 0.8 (calc) — ABNORMAL LOW (ref 1.0–2.5)
ALBUMIN MSPROF: 3.8 g/dL (ref 3.6–5.1)
ALKALINE PHOSPHATASE (APISO): 61 U/L (ref 33–115)
ALT: 21 U/L (ref 6–29)
AST: 18 U/L (ref 10–35)
BUN: 15 mg/dL (ref 7–25)
CO2: 26 mmol/L (ref 20–32)
CREATININE: 0.89 mg/dL (ref 0.50–1.10)
Calcium: 9.8 mg/dL (ref 8.6–10.2)
Chloride: 100 mmol/L (ref 98–110)
GFR, Est African American: 91 mL/min/{1.73_m2} (ref >=60)
GFR, Est Non African American: 78 mL/min/{1.73_m2} (ref >=60)
GLUCOSE: 89 mg/dL (ref 65–139)
Globulin: 4.6 g/dL (calc) — ABNORMAL HIGH (ref 1.9–3.7)
Potassium: 5.1 mmol/L (ref 3.5–5.3)
Sodium: 133 mmol/L — ABNORMAL LOW (ref 135–146)
Total Bilirubin: 0.3 mg/dL (ref 0.2–1.2)
Total Protein: 8.4 g/dL — ABNORMAL HIGH (ref 6.1–8.1)

## 2018-03-15 LAB — CBC WITH DIFFERENTIAL/PLATELET
BASOS ABS: 50 {cells}/uL (ref 0–200)
Basophils Relative: 0.8 %
EOS PCT: 1.6 %
Eosinophils Absolute: 99 cells/uL (ref 15–500)
HCT: 42.3 % (ref 35.0–45.0)
HEMOGLOBIN: 14 g/dL (ref 11.7–15.5)
Lymphs Abs: 1184 cells/uL (ref 850–3900)
MCH: 29.4 pg (ref 27.0–33.0)
MCHC: 33.1 g/dL (ref 32.0–36.0)
MCV: 88.7 fL (ref 80.0–100.0)
MONOS PCT: 11.9 %
MPV: 10.2 fL (ref 7.5–12.5)
NEUTROS ABS: 4129 {cells}/uL (ref 1500–7800)
Neutrophils Relative %: 66.6 %
PLATELETS: 448 10*3/uL — AB (ref 140–400)
RBC: 4.77 10*6/uL (ref 3.80–5.10)
RDW: 13.8 % (ref 11.0–15.0)
Total Lymphocyte: 19.1 %
WBC mixed population: 738 cells/uL (ref 200–950)
WBC: 6.2 10*3/uL (ref 3.8–10.8)

## 2018-03-15 LAB — URINALYSIS, ROUTINE W REFLEX MICROSCOPIC
Bilirubin Urine: NEGATIVE
Glucose, UA: NEGATIVE
Hgb urine dipstick: NEGATIVE
Ketones, ur: NEGATIVE
LEUKOCYTES UA: NEGATIVE
Nitrite: NEGATIVE
PROTEIN: NEGATIVE
SPECIFIC GRAVITY, URINE: 1.025 (ref 1.001–1.03)
pH: <=5 (ref 5.0–8.0)

## 2018-03-16 ENCOUNTER — Other Ambulatory Visit: Payer: Self-pay

## 2018-03-16 ENCOUNTER — Ambulatory Visit (INDEPENDENT_AMBULATORY_CARE_PROVIDER_SITE_OTHER): Payer: BLUE CROSS/BLUE SHIELD | Admitting: Family Medicine

## 2018-03-16 ENCOUNTER — Encounter: Payer: Self-pay | Admitting: Family Medicine

## 2018-03-16 VITALS — BP 116/68 | HR 101 | Temp 98.6°F | Resp 12 | Ht 68.0 in | Wt 205.1 lb

## 2018-03-16 DIAGNOSIS — R5383 Other fatigue: Secondary | ICD-10-CM | POA: Diagnosis not present

## 2018-03-16 DIAGNOSIS — R779 Abnormality of plasma protein, unspecified: Secondary | ICD-10-CM | POA: Diagnosis not present

## 2018-03-16 DIAGNOSIS — D61818 Other pancytopenia: Secondary | ICD-10-CM | POA: Diagnosis not present

## 2018-03-16 NOTE — Progress Notes (Signed)
Patient ID: Amy Douglas, female    DOB: January 05, 1971, 47 y.o.   MRN: [id number]  Chief Complaint  Patient presents with  . Transitions Of Care    Allergies Hydroxyzine pamoate; Amoxicillin-pot clavulanate; Latex; Other; Diclofenac; Levaquin [levofloxacin]; Prednisone; Vistaril [hydroxyzine hcl]; and Lamotrigine  Subjective:   Amy Douglas is a 46 y.o. female who presents to [facility] today.  HPI Amy Douglas presents today for a care visit from the hospital.  She was hospitalized on March 04, 2018 and was discharged on March 07, 2018 from [facility].  Her discharge summary is reviewed today.  Her medications, history, labs, imaging studies and hospital course are reviewed today.  She has been taking the B12 as directed upon her discharge.  She is completed the doxycycline.  All her cultures and viral studies that were pending upon discharge have come back as negative. She was hoping to go back to work tomorrow but is unsure that she would be able to function.  She works at [employer] and walks approximately 5 miles a day or more with her job.  She also has to drive to her job.  She reports that since she was discharged from the hospital that she feels tired, worn out, energy is low. Has been resting and laying on the couch mostly since her discharge.  Her husband reports that she is not usual self.  She is a very active and full of energy.  Patient reports that she got up and made breakfast yesterday and that took most of energy.  She reports that she does not feel depressed just exhausted. Usually high energy. Appetite is low. Lost some weight.  Denies any fevers, nausea, vomiting, diarrhea.  She initially did have some diarrhea upon coming up from the hospital but it has resolved.  She does feel like she is improved but not back to normal self about 50%.  Husband thinks about 35%. Just does not feel good.  She is not hurting anywhere.  She just feels washed out.  Does  not feel down, depressed, or hopeless.  Has not noticed any lymph node enlargement.  Skin rash has resolved.  Patient initially presented to the hospital and it was thought she had a urinary tract infection and pneumonia.  Her second x-ray after initiation of antibiotic showed clearance of pneumonia.  Her blood counts did reveal a pancytopenia.  It was thought that she was septic.  This also correlated with initiation of Lamictal several days prior by neurology for resumed seizures.  Upon admission to the hospital the Lamictal was stopped.  She was started on wide spectrum antibiotics and admitted to the ICU.  All of her cultures and blood work did not reveal evidence of infection.  She is not having any urinary symptoms at this time.  She denies any cough, shortness of breath, or upper respiratory symptoms.  She denies any seizure activity or syncopal episodes.  She is drinking well.  She does have paperwork for work regarding her missing work due to her hospitalization.  She does not feel she is ready to go back to work.  She did not take her Inderal last night for migraine prevention because her blood pressure has been running on the low side of normal and she was concerned about taking it.  She has not had any migraine headaches.  She is still taking her Zoloft for her depression.  She does not relate any changes in her mood.   Past Medical  History:  Diagnosis Date  . Breast cancer ([place])   . Pneumonia     Past Surgical History:  Procedure Laterality Date  . MASTECTOMY    . maxillofacial Bilateral     Family History  Problem Relation Age of Onset  . Hypertension Mother   . Diabetes Father   . Dementia Father   . Heart disease Father      Social History   Socioeconomic History  . Marital status: Married    Spouse name: Not on file  . Number of children: Not on file  . Years of education: Not on file  . Highest education level: Not on file  Occupational History  . Not on file    Social Needs  . Financial resource strain: Not on file  . Food insecurity:    Worry: Not on file    Inability: Not on file  . Transportation needs:    Medical: Not on file    Non-medical: Not on file  Tobacco Use  . Smoking status: Former Smoker    Years: 12.00    Types: Cigarettes    Last attempt to quit: 06/30/2001    Years since quitting: 16.7  . Smokeless tobacco: Never Used  Substance and Sexual Activity  . Alcohol use: No  . Drug use: No  . Sexual activity: Yes  Lifestyle  . Physical activity:    Days per week: Not on file    Minutes per session: Not on file  . Stress: Not on file  Relationships  . Social connections:    Talks on phone: Not on file    Gets together: Not on file    Attends religious service: Not on file    Active member of club or organization: Not on file    Attends meetings of clubs or organizations: Not on file    Relationship status: Not on file  Other Topics Concern  . Not on file  Social History Narrative  . Not on file   Current Outpatient Medications on File Prior to Visit  Medication Sig Dispense Refill  . acetaminophen (TYLENOL) 325 MG tablet Take 325-650 mg by mouth every 6 (six) hours as needed for mild pain.     Marland Kitchen ibuprofen (ADVIL,MOTRIN) 200 MG tablet Take 200-400 mg by mouth every 6 (six) hours as needed for moderate pain.     Marland Kitchen propranolol ER (INDERAL LA) 60 MG 24 hr capsule Take 1 capsule (60 mg total) by mouth daily. 90 capsule 0  . sertraline (ZOLOFT) 100 MG tablet Take 1 tablet (100 mg total) by mouth daily. 90 tablet 1  . vitamin B-12 (CYANOCOBALAMIN) 500 MCG tablet Take 1 tablet (500 mcg total) by mouth daily.     No current facility-administered medications on file prior to visit.      Review of Systems  Constitutional: Positive for fatigue. Negative for activity change, appetite change, fever and unexpected weight change.  HENT: Negative for congestion, ear discharge, facial swelling, nosebleeds, postnasal drip, sinus  pressure, trouble swallowing and voice change.   Eyes: Negative for visual disturbance.  Respiratory: Negative for cough, chest tightness and shortness of breath.   Cardiovascular: Negative for chest pain, palpitations and leg swelling.  Gastrointestinal: Negative for abdominal distention, abdominal pain, blood in stool, constipation, diarrhea, nausea and vomiting.  Genitourinary: Negative for decreased urine volume, difficulty urinating, dysuria, frequency, hematuria and urgency.  Musculoskeletal: Negative for arthralgias, back pain, gait problem, myalgias and neck pain.  Skin: Negative for rash.  Neurological:  Negative for dizziness, tremors, seizures, syncope, facial asymmetry, speech difficulty, weakness, light-headedness, numbness and headaches.  Hematological: Negative for adenopathy. Does not bruise/bleed easily.  Psychiatric/Behavioral: Negative for behavioral problems, dysphoric mood and sleep disturbance.   Depression screen [facility] 2/9 03/16/2018 02/13/2018 07/07/2017 03/28/2017  Decreased Interest 1 0 0 0  Down, Depressed, Hopeless 1 0 0 0  PHQ - 2 Score 2 0 0 0  Altered sleeping 1 - - -  Tired, decreased energy 1 - - -  Change in appetite 1 - - -  Feeling bad or failure about yourself  0 - - -  Trouble concentrating 0 - - -  Moving slowly or fidgety/restless 0 - - -  Suicidal thoughts 0 - - -  PHQ-9 Score 5 - - -   In anticipation of patient's office visit today, complete blood count was ordered yesterday along with electrolytes and liver tests.  Labs did show an improvement in her white blood cell count within the normal range.  She did have some elevated serum protein.  Electrolytes were within normal limits.  Objective:   BP 116/68 (BP Location: Left Arm, Patient Position: Sitting, Cuff Size: Large)   Pulse (!) 101   Temp 98.6 F (37 C) (Temporal)   Resp 12   Ht 5\' 8"  (1.727 m)   Wt 205 lb 1.9 oz (93 kg)   LMP 02/26/2018   SpO2 98% Comment: room air  BMI 31.19 kg/m    Physical Exam  Constitutional: She is oriented to person, place, and time. She appears well-developed and well-nourished. No distress.  Patient appears very tired and low energy. Sitting in chair, not usual bubbly self. Slightly pale.   HENT:  Head: Normocephalic and atraumatic.  Nose: Nose normal.  Mouth/Throat: Oropharynx is clear and moist. No oropharyngeal exudate.  Eyes: Pupils are equal, round, and reactive to light. Conjunctivae are normal. No scleral icterus.  Neck: Normal range of motion. Neck supple. No thyromegaly present.  Cardiovascular: Normal rate, regular rhythm and normal heart sounds.  Pulmonary/Chest: Effort normal and breath sounds normal. No respiratory distress.  Abdominal: Soft. Bowel sounds are normal. She exhibits no distension. There is no guarding.  Musculoskeletal: Normal range of motion. She exhibits no edema.  Neurological: She is alert and oriented to person, place, and time. No cranial nerve deficit. Coordination normal.  Skin: Skin is warm and dry. Capillary refill takes less than 2 seconds. She is not diaphoretic.  Psychiatric: She has a normal mood and affect. Her behavior is normal. Thought content normal.  Nursing note and vitals reviewed.    Assessment and Plan  Nitasha presents for transition of care visit after hospital discharge on March 07, 2018 secondary to febrile illness with associated pancytopenia and presumed sepsis.  Lab studies up to this date have been negative for infectious etiology.  Initial urine studies suggestive of UTI.  Chest x-ray initially with presumptive diagnosis of pneumonia.  Repeat chest x-ray while in the hospital was within normal limits.  Questionable pancytopenia and rash secondary to antiepileptic medication which is now subsequently been discontinued.  Blood counts have improved and rash is resolved.  Still with some elevated serum proteins.  Patient is improving but still with persistent fatigue and low energy state.   She has completed her outpatient course of doxycycline.  Discussed with patient that I believe her low energy is within the range of normal secondary to her recent hospitalization and physical state.  Paperwork is completed today for her to be  out of the work place setting for the next week.  She does work at a job which is physical and does require walking multiple miles a day.  Her energy level and physical state is not conducive to this yet.  We will plan on rechecking lab work prior to her next visit and if she is feeling better at that time will return to work.  Patient's questions and her husband's questions were answered.  Hospital admission and discharge summary reviewed.  Medication reconciliation performed.  Laboratory and x-ray studies reviewed.  Questions were answered.  Office visit was greater than 45 minutes.  Greater than 50% of the office was spent counseling and coordinating care. 1. Pancytopenia ([place]) - CBC with Differential/Platelet 2. Elevated serum protein level - COMPLETE METABOLIC PANEL WITH GFR 3. Fatigue, unspecified type - TSH  Return in about 1 week (around 03/23/2018). [name], MD 03/20/2018

## 2018-03-20 ENCOUNTER — Encounter: Payer: Self-pay | Admitting: Sports Medicine

## 2018-03-20 ENCOUNTER — Ambulatory Visit (INDEPENDENT_AMBULATORY_CARE_PROVIDER_SITE_OTHER): Payer: BLUE CROSS/BLUE SHIELD | Admitting: Sports Medicine

## 2018-03-20 DIAGNOSIS — Z9889 Other specified postprocedural states: Secondary | ICD-10-CM

## 2018-03-20 DIAGNOSIS — M7731 Calcaneal spur, right foot: Secondary | ICD-10-CM | POA: Diagnosis not present

## 2018-03-20 DIAGNOSIS — M79671 Pain in right foot: Secondary | ICD-10-CM

## 2018-03-20 NOTE — Progress Notes (Signed)
Subjective: Amy Douglas is a 47 y.o. female patient seen today in office for POV #7 (DOS 11-20-17, S/P R EPF with Peroneal tendon repair. Patient states that she is doing good still has a little bit of numbness but otherwise is doing good states that her foot has not been giving her any problems or issues but does admit that she had seizures and was admitted to hospital and has not been back to work since her last encounter besides 4 days.  Patient denies any other acute symptoms or pedal complaints at this time.  Patient Active Problem List   Diagnosis Date Noted  . Hypophosphatemia 03/05/2018  . Sepsis due to undetermined organism ([place]) 03/04/2018  . Pancytopenia ([place]) 03/04/2018  . HTN, goal below 140/90 08/07/2017  . Low HDL (under 40) 08/07/2017  . Migraine without status migrainosus, not intractable 03/28/2017  . History of migraine headaches 03/28/2017  . Seasonal allergic rhinitis due to pollen 03/28/2017  . Need for immunization against influenza 03/28/2017  . Anxiety 03/28/2017  . Port-A-Cath in place 04/09/2015  . Carcinoma of right breast ([place]) 03/31/2015  . Chronic pain syndrome 03/31/2015  . Status post right mastectomy 03/31/2015    Current Outpatient Medications on File Prior to Visit  Medication Sig Dispense Refill  . acetaminophen (TYLENOL) 325 MG tablet Take 325-650 mg by mouth every 6 (six) hours as needed for mild pain.     Marland Kitchen ibuprofen (ADVIL,MOTRIN) 200 MG tablet Take 200-400 mg by mouth every 6 (six) hours as needed for moderate pain.     Marland Kitchen propranolol ER (INDERAL LA) 60 MG 24 hr capsule Take 1 capsule (60 mg total) by mouth daily. 90 capsule 0  . sertraline (ZOLOFT) 100 MG tablet Take 1 tablet (100 mg total) by mouth daily. 90 tablet 1  . vitamin B-12 (CYANOCOBALAMIN) 500 MCG tablet Take 1 tablet (500 mcg total) by mouth daily.     No current facility-administered medications on file prior to visit.     Allergies  Allergen Reactions  . Hydroxyzine Pamoate  Itching  . Amoxicillin-Pot Clavulanate Rash    Augmentin   . Latex Other (See Comments)    Rash and skin blistering  . Other Other (See Comments)    States she has a bad reaction to all steroids: fever, insomnia, ams  . Diclofenac     Upsets stomach  . Levaquin [Levofloxacin] Itching  . Prednisone     AMS  . Vistaril [Hydroxyzine Hcl]     Lip Swelling   . Lamotrigine Other (See Comments)    Lowers wbc, fever    Objective: There were no vitals filed for this visit.  General: No acute distress, AAOx3  Right foot: Incisions well healed at surgical sites, minimal swelling to right heel and lateral ankle, no erythema, no warmth, no drainage, no signs of infection noted, Capillary fill time <3 seconds in all digits, gross sensation present via light touch to right foot. + subjective numbness at lateral ankle on right as previous that is slowly improving. No pain or crepitation with range of motion right foot.  No pain with calf compression.   Assessment and Plan:  Problem List Items Addressed This Visit    None    Visit Diagnoses    S/P foot surgery, right    -  Primary   Heel spur, right       Right foot pain          -Patient seen and evaluated -Patient will heal discharge  from postoperative care -Recommend continue with good supportive shoes and gentle massage range of motion exercises and scar creams or gels as needed -Patient to return to office as needed or sooner if problems or issues arise. Landis Martins, DPM

## 2018-03-21 LAB — COMPLETE METABOLIC PANEL WITH GFR
AG Ratio: 0.9 (calc) — ABNORMAL LOW (ref 1.0–2.5)
ALKALINE PHOSPHATASE (APISO): 71 U/L (ref 33–115)
ALT: 16 U/L (ref 6–29)
AST: 21 U/L (ref 10–35)
Albumin: 3.8 g/dL (ref 3.6–5.1)
BUN: 8 mg/dL (ref 7–25)
CO2: 21 mmol/L (ref 20–32)
Calcium: 9.4 mg/dL (ref 8.6–10.2)
Chloride: 104 mmol/L (ref 98–110)
Creat: 0.76 mg/dL (ref 0.50–1.10)
GFR, Est African American: 110 mL/min/{1.73_m2} (ref >=60)
GFR, Est Non African American: 95 mL/min/{1.73_m2} (ref >=60)
GLUCOSE: 91 mg/dL (ref 65–139)
Globulin: 4.4 g/dL (calc) — ABNORMAL HIGH (ref 1.9–3.7)
Potassium: 4.6 mmol/L (ref 3.5–5.3)
SODIUM: 137 mmol/L (ref 135–146)
TOTAL PROTEIN: 8.2 g/dL — AB (ref 6.1–8.1)
Total Bilirubin: 0.4 mg/dL (ref 0.2–1.2)

## 2018-03-21 LAB — CBC WITH DIFFERENTIAL/PLATELET
BASOS PCT: 0.9 %
Basophils Absolute: 31 cells/uL (ref 0–200)
Eosinophils Absolute: 71 cells/uL (ref 15–500)
Eosinophils Relative: 2.1 %
HCT: 39.4 % (ref 35.0–45.0)
Hemoglobin: 13.1 g/dL (ref 11.7–15.5)
Lymphs Abs: 755 cells/uL — ABNORMAL LOW (ref 850–3900)
MCH: 29.2 pg (ref 27.0–33.0)
MCHC: 33.2 g/dL (ref 32.0–36.0)
MCV: 87.9 fL (ref 80.0–100.0)
MONOS PCT: 12.1 %
MPV: 11 fL (ref 7.5–12.5)
Neutro Abs: 2132 cells/uL (ref 1500–7800)
Neutrophils Relative %: 62.7 %
PLATELETS: 232 10*3/uL (ref 140–400)
RBC: 4.48 10*6/uL (ref 3.80–5.10)
RDW: 13.9 % (ref 11.0–15.0)
TOTAL LYMPHOCYTE: 22.2 %
WBC mixed population: 411 cells/uL (ref 200–950)
WBC: 3.4 10*3/uL — ABNORMAL LOW (ref 3.8–10.8)

## 2018-03-21 LAB — TSH: TSH: 3.58 m[IU]/L

## 2018-03-22 ENCOUNTER — Ambulatory Visit (INDEPENDENT_AMBULATORY_CARE_PROVIDER_SITE_OTHER): Payer: BLUE CROSS/BLUE SHIELD | Admitting: Family Medicine

## 2018-03-22 ENCOUNTER — Other Ambulatory Visit: Payer: Self-pay

## 2018-03-22 ENCOUNTER — Encounter: Payer: Self-pay | Admitting: Family Medicine

## 2018-03-22 VITALS — BP 128/78 | HR 100 | Temp 98.2°F | Resp 12 | Ht 68.0 in | Wt 209.0 lb

## 2018-03-22 DIAGNOSIS — D61818 Other pancytopenia: Secondary | ICD-10-CM | POA: Diagnosis not present

## 2018-03-22 NOTE — Progress Notes (Signed)
Patient ID: Amy Douglas, female    DOB: [birth year], 47 y.o.   MRN: [id number]  Chief Complaint  Patient presents with  . Fatigue  . discuss labs    Allergies Hydroxyzine pamoate; Amoxicillin-pot clavulanate; Latex; Other; Diclofenac; Levaquin [levofloxacin]; Prednisone; Vistaril [hydroxyzine hcl]; and Lamotrigine  Subjective:   Amy Douglas is a 47 y.o. female who presents to [facility] today.  HPI [name] presents for follow-up visit today.  She reports that she has been feeling better.  Her energy is improving.  She denies any fevers, nausea, vomiting, or diarrhea.  She does not feel that she is yet ready to go back to work.  Her energy is improved but still not back to her baseline.  She did go get her blood work yesterday in anticipation of her visit today. She denies any cough or dysuria.  She has not yet back to taking her blood pressure medication.  Her appetite is improved.  She does not feel dizzy.  She has been doing more walking and getting back to her usual routine.  Her husband reports that she seems like she is about 70% back to her normal self.  She does not feel down, depressed, or hopeless.  Her husband believes that she is now acting more like her usual self.  She feels she is not quite ready to go back to work but believes that she could be ready within the next week or so.   Past Medical History:  Diagnosis Date  . Breast cancer ([place])   . Pneumonia     Past Surgical History:  Procedure Laterality Date  . MASTECTOMY    . maxillofacial Bilateral     Family History  Problem Relation Age of Onset  . Hypertension Mother   . Diabetes Father   . Dementia Father   . Heart disease Father      Social History   Socioeconomic History  . Marital status: Married    Spouse name: Not on file  . Number of children: Not on file  . Years of education: Not on file  . Highest education level: Not on file  Occupational History  . Not on file  Social  Needs  . Financial resource strain: Not on file  . Food insecurity:    Worry: Not on file    Inability: Not on file  . Transportation needs:    Medical: Not on file    Non-medical: Not on file  Tobacco Use  . Smoking status: Former Smoker    Years: 12.00    Types: Cigarettes    Last attempt to quit: 06/30/2001    Years since quitting: 16.7  . Smokeless tobacco: Never Used  Substance and Sexual Activity  . Alcohol use: No  . Drug use: No  . Sexual activity: Yes  Lifestyle  . Physical activity:    Days per week: Not on file    Minutes per session: Not on file  . Stress: Not on file  Relationships  . Social connections:    Talks on phone: Not on file    Gets together: Not on file    Attends religious service: Not on file    Active member of club or organization: Not on file    Attends meetings of clubs or organizations: Not on file    Relationship status: Not on file  Other Topics Concern  . Not on file  Social History Narrative  . Not on file   Current Outpatient  Medications on File Prior to Visit  Medication Sig Dispense Refill  . acetaminophen (TYLENOL) 325 MG tablet Take 325-650 mg by mouth every 6 (six) hours as needed for mild pain.     Marland Kitchen ibuprofen (ADVIL,MOTRIN) 200 MG tablet Take 200-400 mg by mouth every 6 (six) hours as needed for moderate pain.     Marland Kitchen omeprazole (PRILOSEC) 20 MG capsule Take 20 mg by mouth daily.    . propranolol ER (INDERAL LA) 60 MG 24 hr capsule Take 1 capsule (60 mg total) by mouth daily. 90 capsule 0  . sertraline (ZOLOFT) 100 MG tablet Take 1 tablet (100 mg total) by mouth daily. 90 tablet 1  . vitamin B-12 (CYANOCOBALAMIN) 500 MCG tablet Take 1 tablet (500 mcg total) by mouth daily.     No current facility-administered medications on file prior to visit.     Review of Systems  Constitutional: Positive for fatigue. Negative for activity change, appetite change and fever.  Eyes: Negative for visual disturbance.  Respiratory: Negative  for cough, chest tightness and shortness of breath.   Cardiovascular: Negative for chest pain, palpitations and leg swelling.  Gastrointestinal: Negative for abdominal pain, nausea and vomiting.  Genitourinary: Negative for dysuria, frequency and urgency.  Neurological: Negative for dizziness, syncope and light-headedness.  Hematological: Negative for adenopathy.  Psychiatric/Behavioral: Negative for behavioral problems, confusion, dysphoric mood, hallucinations and sleep disturbance.     Objective:   BP 128/78 (BP Location: Left Arm, Patient Position: Sitting, Cuff Size: Large)   Pulse 100   Temp 98.2 F (36.8 C) (Temporal)   Resp 12   Ht 5\' 8"  (1.727 m)   Wt 209 lb (94.8 kg)   LMP 02/26/2018   SpO2 99% Comment: room air  BMI 31.78 kg/m   Physical Exam  Constitutional: She is oriented to person, place, and time. She appears well-developed and well-nourished. No distress.  HENT:  Head: Normocephalic and atraumatic.  Eyes: Pupils are equal, round, and reactive to light.  Neck: Normal range of motion. Neck supple. No thyromegaly present.  Cardiovascular: Normal rate, regular rhythm and normal heart sounds.  Pulmonary/Chest: Effort normal and breath sounds normal. No respiratory distress.  Neurological: She is alert and oriented to person, place, and time. No cranial nerve deficit.  Skin: Skin is warm and dry.  Nursing note and vitals reviewed.    Assessment and Plan  1. Pancytopenia ([place]) Recent hospitalization/discharged on 03/07/2018 secondary to presumed sepsis and reaction to antiepileptic medication with resultant pancytopenia.  Upon discharge home her white blood cell count had improved to 6.2 with normalization in all of her cell lines.  However, her blood work from yesterday did reveal a drop in her white blood cell count, hemoglobin, and platelets.  Her hemoglobin and platelets are still within the range of normal.  However her white blood count is now 3.4. I will not  yet release her to return to work.  I recommend she be out of work for the next week.  She will follow-up on Monday, 3 days from now.  She was told that if she develops any fevers, chills, lymph node swelling, or any other worrisome signs and symptoms she needs to go to the emergency department for evaluation.  Prior to her office visit Monday morning she will go to the hospital laboratory and get stat blood work for review at the visit.  She was told to call with any questions or concerns.  She was told that if her blood counts have stabilized  and she continues to improve that she could return to to work in 1 week and I would complete paperwork at the follow-up visit.  Questions were answered.  Electrolytes and lab tests were reviewed with patient. - CBC with Differential; Future  No follow-ups on file. [name], MD 03/25/2018

## 2018-03-26 ENCOUNTER — Other Ambulatory Visit: Payer: Self-pay

## 2018-03-26 ENCOUNTER — Ambulatory Visit (INDEPENDENT_AMBULATORY_CARE_PROVIDER_SITE_OTHER): Payer: BLUE CROSS/BLUE SHIELD | Admitting: Family Medicine

## 2018-03-26 ENCOUNTER — Encounter: Payer: Self-pay | Admitting: Family Medicine

## 2018-03-26 ENCOUNTER — Other Ambulatory Visit (HOSPITAL_COMMUNITY)
Admission: RE | Admit: 2018-03-26 | Discharge: 2018-03-26 | Disposition: A | Discharge status: Home / Self Care | Payer: BLUE CROSS/BLUE SHIELD | Source: Ambulatory Visit | Attending: Family Medicine | Admitting: Family Medicine

## 2018-03-26 VITALS — BP 118/78 | HR 97 | Temp 98.7°F | Resp 12 | Ht 68.0 in | Wt 211.0 lb

## 2018-03-26 DIAGNOSIS — D61818 Other pancytopenia: Secondary | ICD-10-CM

## 2018-03-26 DIAGNOSIS — G43909 Migraine, unspecified, not intractable, without status migrainosus: Secondary | ICD-10-CM

## 2018-03-26 DIAGNOSIS — R1013 Epigastric pain: Secondary | ICD-10-CM

## 2018-03-26 DIAGNOSIS — R5383 Other fatigue: Secondary | ICD-10-CM

## 2018-03-26 DIAGNOSIS — T50905A Adverse effect of unspecified drugs, medicaments and biological substances, initial encounter: Secondary | ICD-10-CM

## 2018-03-26 LAB — CBC WITH DIFFERENTIAL/PLATELET
Basophils Absolute: 0 10*3/uL (ref 0.0–0.1)
Basophils Relative: 0 %
Eosinophils Absolute: 0.2 10*3/uL (ref 0.0–0.7)
Eosinophils Relative: 3 %
HEMATOCRIT: 39.7 % (ref 36.0–46.0)
HEMOGLOBIN: 13.2 g/dL (ref 12.0–15.0)
LYMPHS PCT: 17 %
Lymphs Abs: 0.9 10*3/uL (ref 0.7–4.0)
MCH: 29.5 pg (ref 26.0–34.0)
MCHC: 33.2 g/dL (ref 30.0–36.0)
MCV: 88.8 fL (ref 78.0–100.0)
Monocytes Absolute: 0.7 10*3/uL (ref 0.1–1.0)
Monocytes Relative: 13 %
NEUTROS ABS: 3.6 10*3/uL (ref 1.7–7.7)
Neutrophils Relative %: 67 %
Platelets: 203 10*3/uL (ref 150–400)
RBC: 4.47 MIL/uL (ref 3.87–5.11)
RDW: 14 % (ref 11.5–15.5)
WBC: 5.4 10*3/uL (ref 4.0–10.5)

## 2018-03-26 MED ORDER — SUCRALFATE 1 GM/10ML PO SUSP: 1.0000 g | Freq: Three times a day (TID) | ORAL | 0 refills | Status: DC

## 2018-03-26 MED ORDER — OMEPRAZOLE 20 MG PO CPDR: 20.0000 mg | DELAYED_RELEASE_CAPSULE | Freq: Two times a day (BID) | ORAL | 1 refills | Status: DC

## 2018-03-26 NOTE — Progress Notes (Signed)
Follow up    Patient ID: Amy Douglas, female    DOB: August 22, 1970, 47 y.o.   MRN: [id number]  Chief Complaint  Patient presents with  . Pancytopenia    follow up    Allergies Hydroxyzine pamoate; Amoxicillin-pot clavulanate; Latex; Other; Diclofenac; Levaquin [levofloxacin]; Prednisone; Vistaril [hydroxyzine hcl]; and Lamotrigine  Subjective:   Amy Douglas is a 47 y.o. female who presents to [facility] today.  HPI Amy Douglas presents for follow-up visit today.  She reports that she is still experiencing heartburn on a daily basis.  She has been taking omeprazole 20 mg p.o. daily since was in the hospital. Has had GERD for years on and off, but reports that after being started on phosphorus tablets in the hospital she started having some heartburn.  She specifically remembers an incident where he feels that 1 of the tablets got stuck in her throat and she had a difficult time swallowing it down.  She reports that since that time she has been having some heartburn and pain in her chest.  This is been going on for several weeks.  She does not feel that it is gotten much better. She describes this as a sharp pain in the center of chest wall, radiates to the right neck. Better with not eating. Pain is on and off.  Reports that the pain will come and go, it lasts 30 min then eases off and goes away. Has been taking sodium bicarbonate, helps some. Worse with eating and drinking.  Does not make SOB.  If has to cough or clear throat then feels like is going to vomit. No nausea   Since initial transition of care visit from the hospital has been off of her beta-blocker for migraine headache prophylaxis.  She reports that for the past 5 or so days that she has been experiencing a headache. Mild, 2/10 pain. Now dull ache.  Not the worst headache of her life.  Vision is okay.  No nausea or vomiting.  Still feels like her energy is somewhat low.  Has not been eating as much because of the  epigastric pain.  Her usual workweek starts on a Saturday.  She works 12-hour shifts.  She is not yet feel ready to return back to work.  She feels she needs time to get her headaches, epigastric pain, and energy level back to normal levels.    Past Medical History:  Diagnosis Date  . Breast cancer ([place])   . Pneumonia     Past Surgical History:  Procedure Laterality Date  . MASTECTOMY    . maxillofacial Bilateral     Family History  Problem Relation Age of Onset  . Hypertension Mother   . Diabetes Father   . Dementia Father   . Heart disease Father      Social History   Socioeconomic History  . Marital status: Married    Spouse name: Not on file  . Number of children: Not on file  . Years of education: Not on file  . Highest education level: Not on file  Occupational History  . Not on file  Social Needs  . Financial resource strain: Not on file  . Food insecurity:    Worry: Not on file    Inability: Not on file  . Transportation needs:    Medical: Not on file    Non-medical: Not on file  Tobacco Use  . Smoking status: Former Smoker    Years: 12.00    Types: Cigarettes  Last attempt to quit: 06/30/2001    Years since quitting: 16.7  . Smokeless tobacco: Never Used  Substance and Sexual Activity  . Alcohol use: No  . Drug use: No  . Sexual activity: Yes  Lifestyle  . Physical activity:    Days per week: Not on file    Minutes per session: Not on file  . Stress: Not on file  Relationships  . Social connections:    Talks on phone: Not on file    Gets together: Not on file    Attends religious service: Not on file    Active member of club or organization: Not on file    Attends meetings of clubs or organizations: Not on file    Relationship status: Not on file  Other Topics Concern  . Not on file  Social History Narrative  . Not on file   Current Outpatient Medications on File Prior to Visit  Medication Sig Dispense Refill  . acetaminophen  (TYLENOL) 325 MG tablet Take 325-650 mg by mouth every 6 (six) hours as needed for mild pain.     Marland Kitchen propranolol ER (INDERAL LA) 60 MG 24 hr capsule Take 1 capsule (60 mg total) by mouth daily. 90 capsule 0  . sertraline (ZOLOFT) 100 MG tablet Take 1 tablet (100 mg total) by mouth daily. 90 tablet 1  . vitamin B-12 (CYANOCOBALAMIN) 500 MCG tablet Take 1 tablet (500 mcg total) by mouth daily.     No current facility-administered medications on file prior to visit.     Review of Systems  Constitutional: Positive for fatigue. Negative for activity change, appetite change, chills, diaphoresis and fever.  HENT: Negative for congestion, dental problem, facial swelling, mouth sores, sinus pressure, trouble swallowing and voice change.   Eyes: Negative for photophobia and visual disturbance.  Respiratory: Negative for cough, chest tightness and shortness of breath.   Cardiovascular: Negative for chest pain, palpitations and leg swelling.  Gastrointestinal: Negative for abdominal distention, abdominal pain, anal bleeding, blood in stool, constipation, diarrhea, nausea, rectal pain and vomiting.  Genitourinary: Negative for dysuria, frequency, hematuria and urgency.  Musculoskeletal: Negative for back pain, neck pain and neck stiffness.  Skin: Negative for rash.  Neurological: Negative for dizziness, tremors, seizures, syncope, facial asymmetry, speech difficulty, weakness, light-headedness, numbness and headaches.  Hematological: Negative for adenopathy. Does not bruise/bleed easily.  Psychiatric/Behavioral: Negative for dysphoric mood. The patient is not nervous/anxious.      Objective:   BP 118/78 (BP Location: Left Arm, Patient Position: Sitting, Cuff Size: Large)   Pulse 97   Temp 98.7 F (37.1 C) (Temporal)   Resp 12   Ht 5\' 8"  (1.727 m)   Wt 211 lb 0.6 oz (95.7 kg)   LMP 02/26/2018   SpO2 98% Comment: room air  BMI 32.09 kg/m   Physical Exam  Constitutional: She is oriented to  person, place, and time. She appears well-developed and well-nourished. No distress.  Tired and fatigued.  HENT:  Head: Normocephalic and atraumatic.  Left Ear: External ear normal.  Nose: Nose normal.  Mouth/Throat: Oropharynx is clear and moist.  Eyes: Pupils are equal, round, and reactive to light. No scleral icterus.  Neck: Normal range of motion. Neck supple. No thyromegaly present.  Cardiovascular: Normal rate, regular rhythm and normal heart sounds.  Pulmonary/Chest: Effort normal and breath sounds normal. No respiratory distress.  Abdominal: Soft. Bowel sounds are normal. She exhibits no distension and no mass. There is no tenderness. There is no guarding.  Musculoskeletal: Normal range of motion. She exhibits no edema.  Neurological: She is alert and oriented to person, place, and time. No cranial nerve deficit.  Skin: Skin is warm and dry. No rash noted.  Psychiatric: She has a normal mood and affect. Her behavior is normal. Thought content normal.  Nursing note and vitals reviewed.    Assessment and Plan  1. Drug-induced dyspepsia Discussed with patient and her husband today that I believe her symptoms of epigastric pain are related to gastritis/esophagitis which is most likely medication induced/stress ulcer irritation from hospitalization.  Counseling given.  Told to avoid any NSAIDs/Goody powders at this time for headache.  Increase Prilosec to twice a day.  Start Carafate as directed.  Bland diet recommended.  Reflux precautions discussed.  Patient was counseled concerning worrisome signs and symptoms of epigastric pain/signs of an ulcer.  She was told if those occur to go to urgent care or emergency department.  If her symptoms are not improved in the next several days or if they worsen sooner than that, she should call and we will place a referral to gastroenterology for evaluation. - sucralfate (CARAFATE) 1 GM/10ML suspension; Take 10 mLs (1 g total) by mouth 4 (four) times  daily -  with meals and at bedtime.  Dispense: 420 mL; Refill: 0 - omeprazole (PRILOSEC) 20 MG capsule; Take 1 capsule (20 mg total) by mouth 2 (two) times daily before a meal.  Dispense: 60 capsule; Refill: 1  2. Pancytopenia ([place]) Repeat CBC with differential done this morning with normal cell lines and numbers.  Counseling was given.  Plan to recheck in 2 to 3 months or sooner if needed.  3. Fatigue, unspecified type Fatigue is still present but is improving.  Suspect still low secondary to persistent epigastric pain/dyspepsia and migraine headache.  Continue to monitor.  4. Migraine without status migrainosus, not intractable, unspecified migraine type Restart Inderal LA 60 mg p.o. daily.  Also start riboflavin 400 mg p.o. daily.  May use Tylenol over-the-counter as directed.  Avoidance of any NSAID products secondary to GI risks.  Paperwork was completed for patient to return to work on September 28 for 6-hour shifts.  After 1 week she may then return to regular work schedule if she is tolerating those shifts without difficulty or problems.  She was told to follow-up with new PCP office in 2 to 4 weeks or sooner if needed.  If symptoms are not getting better or if they are worsening she should follow-up in new PCP office or at urgent care.  Patient was counseled.  Questions were answered.  Greater than 50% of office visit was spent counseling and coordinating care.  Office visit was 25 minutes.  No follow-ups on file. [name], MD 03/26/2018

## 2018-03-26 NOTE — Patient Instructions (Addendum)
Potassium Content of Foods Potassium is a mineral found in many foods and drinks. It helps keep fluids and minerals balanced in your body and affects how steadily your heart beats. Potassium also helps control your blood pressure and keep your muscles and nervous system healthy. Certain health conditions and medicines may change the balance of potassium in your body. When this happens, you can help balance your level of potassium through the foods that you do or do not eat. Your health care provider or dietitian may recommend an amount of potassium that you should have each day. The following lists of foods provide the amount of potassium (in parentheses) per serving in each item. High in potassium The following foods and beverages have 200 mg or more of potassium per serving:  Apricots, 2 raw or 5 dry (200 mg).  Artichoke, 1 medium (345 mg).  Avocado, raw,  each (245 mg).  Banana, 1 medium (425 mg).  Beans, lima, or baked beans, canned,  cup (280 mg).  Beans, white, canned,  cup (595 mg).  Beef roast, 3 oz (320 mg).  Beef, ground, 3 oz (270 mg).  Beets, raw or cooked,  cup (260 mg).  Bran muffin, 2 oz (300 mg).  Broccoli,  cup (230 mg).  Brussels sprouts,  cup (250 mg).  Cantaloupe,  cup (215 mg).  Cereal, 100% bran,  cup (200-400 mg).  Cheeseburger, single, fast food, 1 each (225-400 mg).  Chicken, 3 oz (220 mg).  Clams, canned, 3 oz (535 mg).  Crab, 3 oz (225 mg).  Dates, 5 each (270 mg).  Dried beans and peas,  cup (300-475 mg).  Figs, dried, 2 each (260 mg).  Fish: halibut, tuna, cod, snapper, 3 oz (480 mg).  Fish: salmon, haddock, swordfish, perch, 3 oz (300 mg).  Fish, tuna, canned 3 oz (200 mg).  [place] fries, fast food, 3 oz (470 mg).  Granola with fruit and nuts,  cup (200 mg).  Grapefruit juice,  cup (200 mg).  Greens, beet,  cup (655 mg).  Honeydew melon,  cup (200 mg).  Kale, raw, 1 cup (300 mg).  Kiwi, 1 medium  (240 mg).  Kohlrabi, rutabaga, parsnips,  cup (280 mg).  Lentils,  cup (365 mg).  Mango, 1 each (325 mg).  Milk, chocolate, 1 cup (420 mg).  Milk: nonfat, low-fat, whole, buttermilk, 1 cup (350-380 mg).  Molasses, 1 Tbsp (295 mg).  Mushrooms,  cup (280) mg.  Nectarine, 1 each (275 mg).  Nuts: almonds, peanuts, hazelnuts, [place], cashew, mixed, 1 oz (200 mg).  Nuts, pistachios, 1 oz (295 mg).  Orange, 1 each (240 mg).  Orange juice,  cup (235 mg).  Papaya, medium,  fruit (390 mg).  Peanut butter, chunky, 2 Tbsp (240 mg).  Peanut butter, smooth, 2 Tbsp (210 mg).  Pear, 1 medium (200 mg).  Pomegranate, 1 whole (400 mg).  Pomegranate juice,  cup (215 mg).  Pork, 3 oz (350 mg).  Potato chips, salted, 1 oz (465 mg).  Potato, baked with skin, 1 medium (925 mg).  Potatoes, boiled,  cup (255 mg).  Potatoes, mashed,  cup (330 mg).  Prune juice,  cup (370 mg).  Prunes, 5 each (305 mg).  Pudding, chocolate,  cup (230 mg).  Pumpkin, canned,  cup (250 mg).  Raisins, seedless,  cup (270 mg).  Seeds, sunflower or pumpkin, 1 oz (240 mg).  Soy milk, 1 cup (300 mg).  Spinach,  cup (420 mg).  Spinach, canned,  cup (  370 mg).  Sweet potato, baked with skin, 1 medium (450 mg).  Swiss chard,  cup (480 mg).  Tomato or vegetable juice,  cup (275 mg).  Tomato sauce or puree,  cup (400-550 mg).  Tomato, raw, 1 medium (290 mg).  Tomatoes, canned,  cup (200-300 mg).  [place], 3 oz (250 mg).  Wheat germ, 1 oz (250 mg).  Winter squash,  cup (250 mg).  Yogurt, plain or fruited, 6 oz (260-435 mg).  Zucchini,  cup (220 mg).  Moderate in potassium The following foods and beverages have 50-200 mg of potassium per serving:  Apple, 1 each (150 mg).  Apple juice,  cup (150 mg).  Applesauce,  cup (90 mg).  Apricot nectar,  cup (140 mg).  Asparagus, small spears,  cup or 6 spears (155 mg).  Bagel, cinnamon raisin, 1 each (130  mg).  Bagel, egg or plain, 4 in., 1 each (70 mg).  Beans, green,  cup (90 mg).  Beans, yellow,  cup (190 mg).  Beer, regular, 12 oz (100 mg).  Beets, canned,  cup (125 mg).  Blackberries,  cup (115 mg).  Blueberries,  cup (60 mg).  Bread, whole wheat, 1 slice (70 mg).  Broccoli, raw,  cup (145 mg).  Cabbage,  cup (150 mg).  Carrots, cooked or raw,  cup (180 mg).  Cauliflower, raw,  cup (150 mg).  Celery, raw,  cup (155 mg).  Cereal, bran flakes, cup (120-150 mg).  Cheese, cottage,  cup (110 mg).  Cherries, 10 each (150 mg).  Chocolate, 1 oz bar (165 mg).  Coffee, brewed 6 oz (90 mg).  Corn,  cup or 1 ear (195 mg).  Cucumbers,  cup (80 mg).  Egg, large, 1 each (60 mg).  Eggplant,  cup (60 mg).  Endive, raw, cup (80 mg).  English muffin, 1 each (65 mg).  Fish, orange roughy, 3 oz (150 mg).  Frankfurter, beef or pork, 1 each (75 mg).  Fruit cocktail,  cup (115 mg).  Grape juice,  cup (170 mg).  Grapefruit,  fruit (175 mg).  Grapes,  cup (155 mg).  Greens: kale, turnip, collard,  cup (110-150 mg).  Ice cream or frozen yogurt, chocolate,  cup (175 mg).  Ice cream or frozen yogurt, vanilla,  cup (120-150 mg).  Lemons, limes, 1 each (80 mg).  Lettuce, all types, 1 cup (100 mg).  Mixed vegetables,  cup (150 mg).  Mushrooms, raw,  cup (110 mg).  Nuts: walnuts, pecans, or macadamia, 1 oz (125 mg).  Oatmeal,  cup (80 mg).  Okra,  cup (110 mg).  Onions, raw,  cup (120 mg).  Peach, 1 each (185 mg).  Peaches, canned,  cup (120 mg).  Pears, canned,  cup (120 mg).  Peas, green, frozen,  cup (90 mg).  Peppers, green,  cup (130 mg).  Peppers, red,  cup (160 mg).  Pineapple juice,  cup (165 mg).  Pineapple, fresh or canned,  cup (100 mg).  Plums, 1 each (105 mg).  Pudding, vanilla,  cup (150 mg).  Raspberries,  cup (90 mg).  Rhubarb,  cup (115 mg).  Rice, wild,  cup (80 mg).  Shrimp, 3  oz (155 mg).  Spinach, raw, 1 cup (170 mg).  Strawberries,  cup (125 mg).  Summer squash  cup (175-200 mg).  Swiss chard, raw, 1 cup (135 mg).  Tangerines, 1 each (140 mg).  Tea, brewed, 6 oz (65 mg).  Turnips,  cup (140 mg).  Watermelon,  cup (85 mg).  Wine, red, table, 5 oz (180 mg).  Wine, white, table, 5 oz (100 mg).  Low in potassium The following foods and beverages have less than 50 mg of potassium per serving.  Bread, white, 1 slice (30 mg).  Carbonated beverages, 12 oz (less than 5 mg).  Cheese, 1 oz (20-30 mg).  Cranberries,  cup (45 mg).  Cranberry juice cocktail,  cup (20 mg).  Fats and oils, 1 Tbsp (less than 5 mg).  Hummus, 1 Tbsp (32 mg).  Nectar: papaya, mango, or pear,  cup (35 mg).  Rice, white or brown,  cup (50 mg).  Spaghetti or macaroni,  cup cooked (30 mg).  Tortilla, flour or corn, 1 each (50 mg).  Waffle, 4 in., 1 each (50 mg).  Water chestnuts,  cup (40 mg).  This information is not intended to replace advice given to you by your health care provider. Make sure you discuss any questions you have with your health care provider. Document Released: 02/01/2005 Document Revised: 11/26/2015 Document Reviewed: 05/17/2013 Elsevier Interactive Patient Education  2018 [employer]. Riboflavin, Vitamin B2 tablets What is this medicine? RIBOFLAVIN; VITAMIN B2 (rahy boh FLEY vin; VAHY tuh min B2) is a vitamin supplement. It is used to prevent and to treat low levels of vitamin B2. This medicine may be used for other purposes; ask your health care provider or pharmacist if you have questions. What should I tell my health care provider before I take this medicine? They need to know if you have any of the following conditions: -an unusual or allergic reaction to B vitamins, other medicines, foods, dyes, or preservatives -pregnant or trying to get pregnant -breast-feeding How should I use this medicine? Take this medicine by mouth  with a glass of water. Follow the directions on the package or prescription label. Take with meals. Take your medicine at regular intervals. Do not take your medicine more often than directed. Talk to your pediatrician regarding the use of this medicine in children. Special care may be needed. Overdosage: If you think you have taken too much of this medicine contact a poison control center or emergency room at once. NOTE: This medicine is only for you. Do not share this medicine with others. What if I miss a dose? If you miss a dose, take it as soon as you can. If it is almost time for your next dose, take only that dose. Do not take double or extra doses. What may interact with this medicine? Interactions are not expected. This list may not describe all possible interactions. Give your health care provider a list of all the medicines, herbs, non-prescription drugs, or dietary supplements you use. Also tell them if you smoke, drink alcohol, or use illegal drugs. Some items may interact with your medicine. What should I watch for while using this medicine? Follow a good diet. Taking a vitamin supplement does not replace the need for a balanced diet. Some foods that have vitamin B2 are milk, meat, eggs, nuts, enriched flour, and green vegetables. What side effects may I notice from receiving this medicine? Side effects that you should report to your doctor or health care professional as soon as possible: -allergic reactions like skin rash, itching or hives, swelling of the face, lips, or tongue Side effects that usually do not require medical attention (report to your doctor or health care professional if they continue or are bothersome): -bad taste in the mouth -bright yellow urine -stomach upset This list may not describe all possible  side effects. Call your doctor for medical advice about side effects. You may report side effects to FDA at 1-800-FDA-1088. Where should I keep my medicine? Keep  out of the reach of children. Store at room temperature between 15 and 30 degrees C (59 and 86 degrees C) or as directed on the package label. Protect from heat and moisture. Throw away any unused medicine after the expiration date. NOTE: This sheet is a summary. It may not cover all possible information. If you have questions about this medicine, talk to your doctor, pharmacist, or health care provider.  2018 Elsevier/Gold Standard (2008-02-18 14:17:21)

## 2018-03-28 ENCOUNTER — Ambulatory Visit: Payer: BLUE CROSS/BLUE SHIELD | Admitting: Cardiology

## 2018-03-29 ENCOUNTER — Encounter: Payer: Self-pay | Admitting: Cardiology

## 2018-03-30 ENCOUNTER — Encounter: Payer: Self-pay | Admitting: Family Medicine

## 2018-04-05 NOTE — Progress Notes (Signed)
DOS: 11-20-2017 RT; endoscopic plantar fascial release and removal of spur, peroneal tendon repair   [place]

## 2018-04-22 ENCOUNTER — Other Ambulatory Visit: Payer: Self-pay | Admitting: Family Medicine

## 2018-04-22 DIAGNOSIS — I10 Essential (primary) hypertension: Secondary | ICD-10-CM

## 2018-05-02 ENCOUNTER — Other Ambulatory Visit: Payer: Self-pay | Admitting: Family Medicine

## 2018-05-02 DIAGNOSIS — I10 Essential (primary) hypertension: Secondary | ICD-10-CM

## 2018-08-24 ENCOUNTER — Other Ambulatory Visit: Payer: Self-pay | Admitting: Family Medicine

## 2018-08-24 DIAGNOSIS — Z1231 Encounter for screening mammogram for malignant neoplasm of breast: Secondary | ICD-10-CM

## 2018-09-10 ENCOUNTER — Encounter: Payer: Self-pay | Admitting: Gastroenterology

## 2018-09-23 IMAGING — DX DG CHEST 2V
2 series · 2 of 2 positions shown · non-contrast
Comparison: None.

CLINICAL DATA: Fever and body aches for the past 2 days.

EXAM:
CHEST - 2 VIEW

[chest pa]
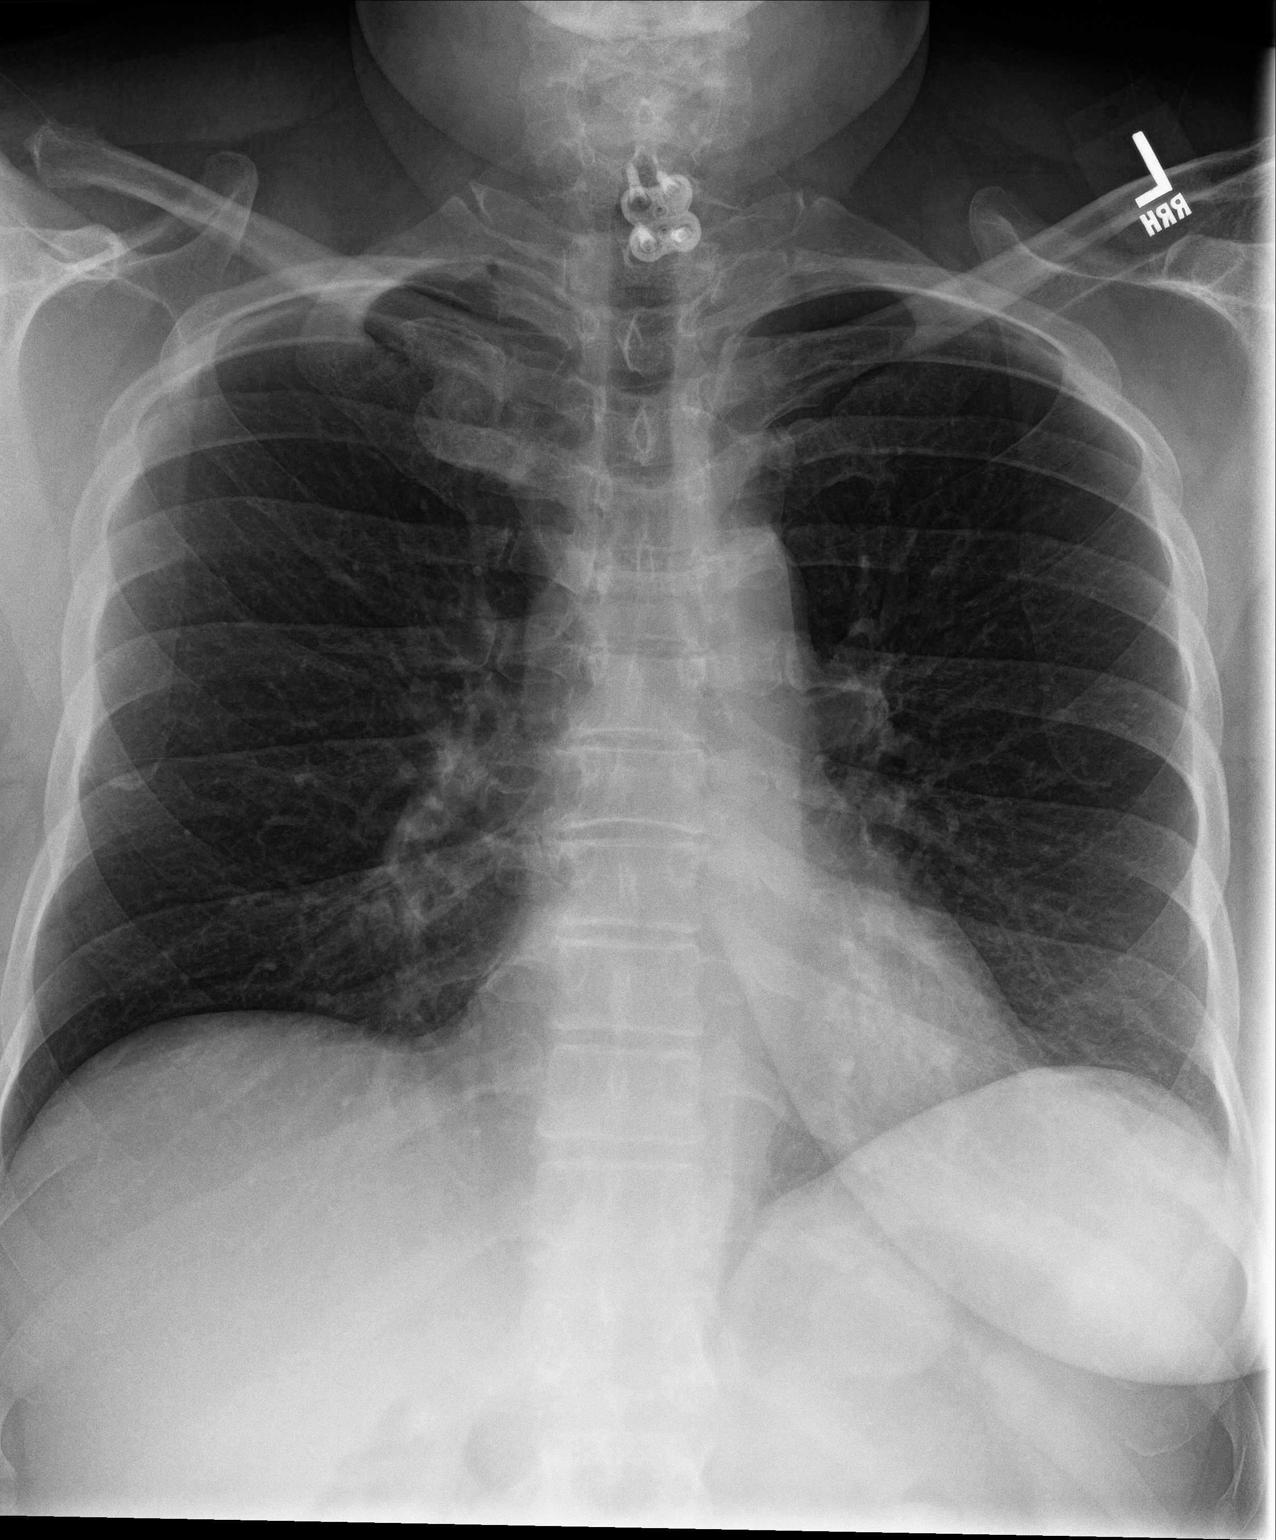

[chest lat]
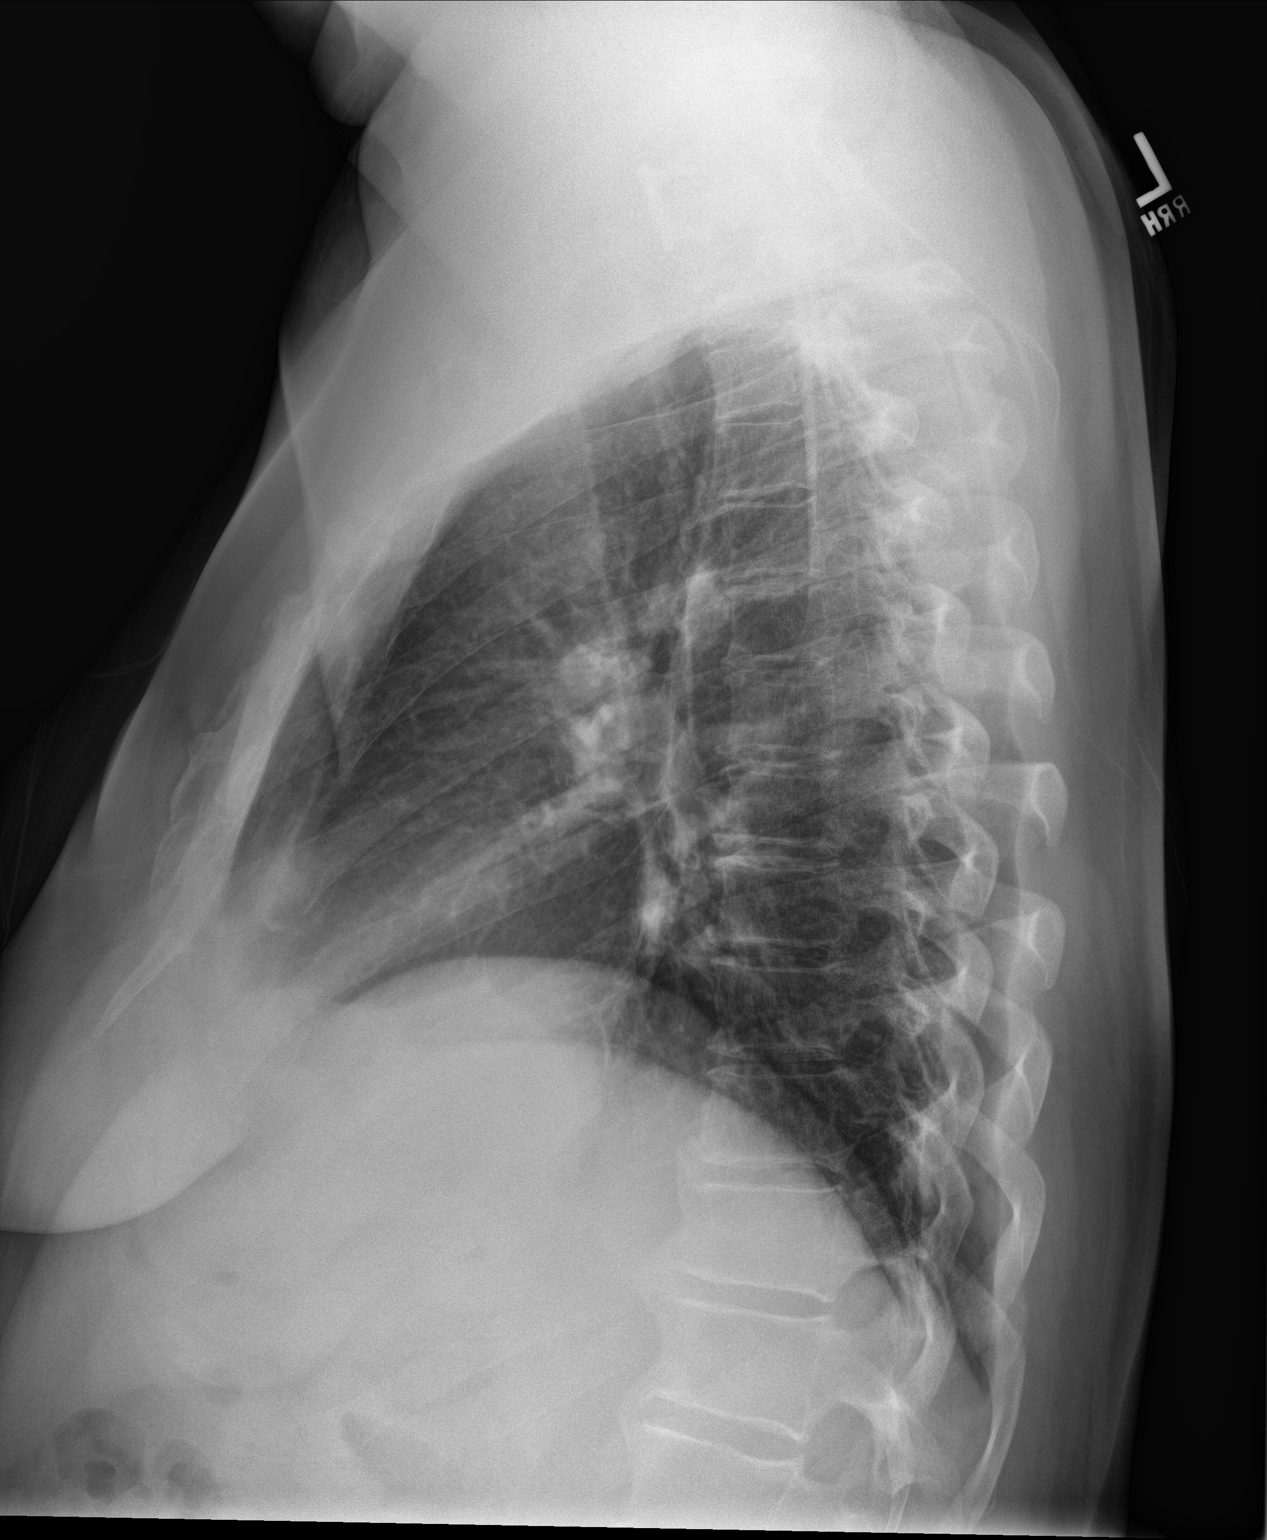

[2 of 2 positions shown; findings below may reference images not displayed]

FINDINGS: The heart size and mediastinal contours are within normal limits.
Normal pulmonary vascularity. Subtle increased patchy density in the
retrocardiac left lower lobe. No pleural effusion or pneumothorax.
Lower cervical ACDF. No acute osseous abnormality.
IMPRESSION: Mild patchy density in the retrocardiac left lower lobe, suspicious
for pneumonia.

## 2018-09-25 IMAGING — DX DG CHEST 2V
2 series · 2 of 2 positions shown · non-contrast
Comparison: 03/02/2018

CLINICAL DATA: Cough, fever, recent pneumonia

EXAM:
CHEST - 2 VIEW

[chest lat]
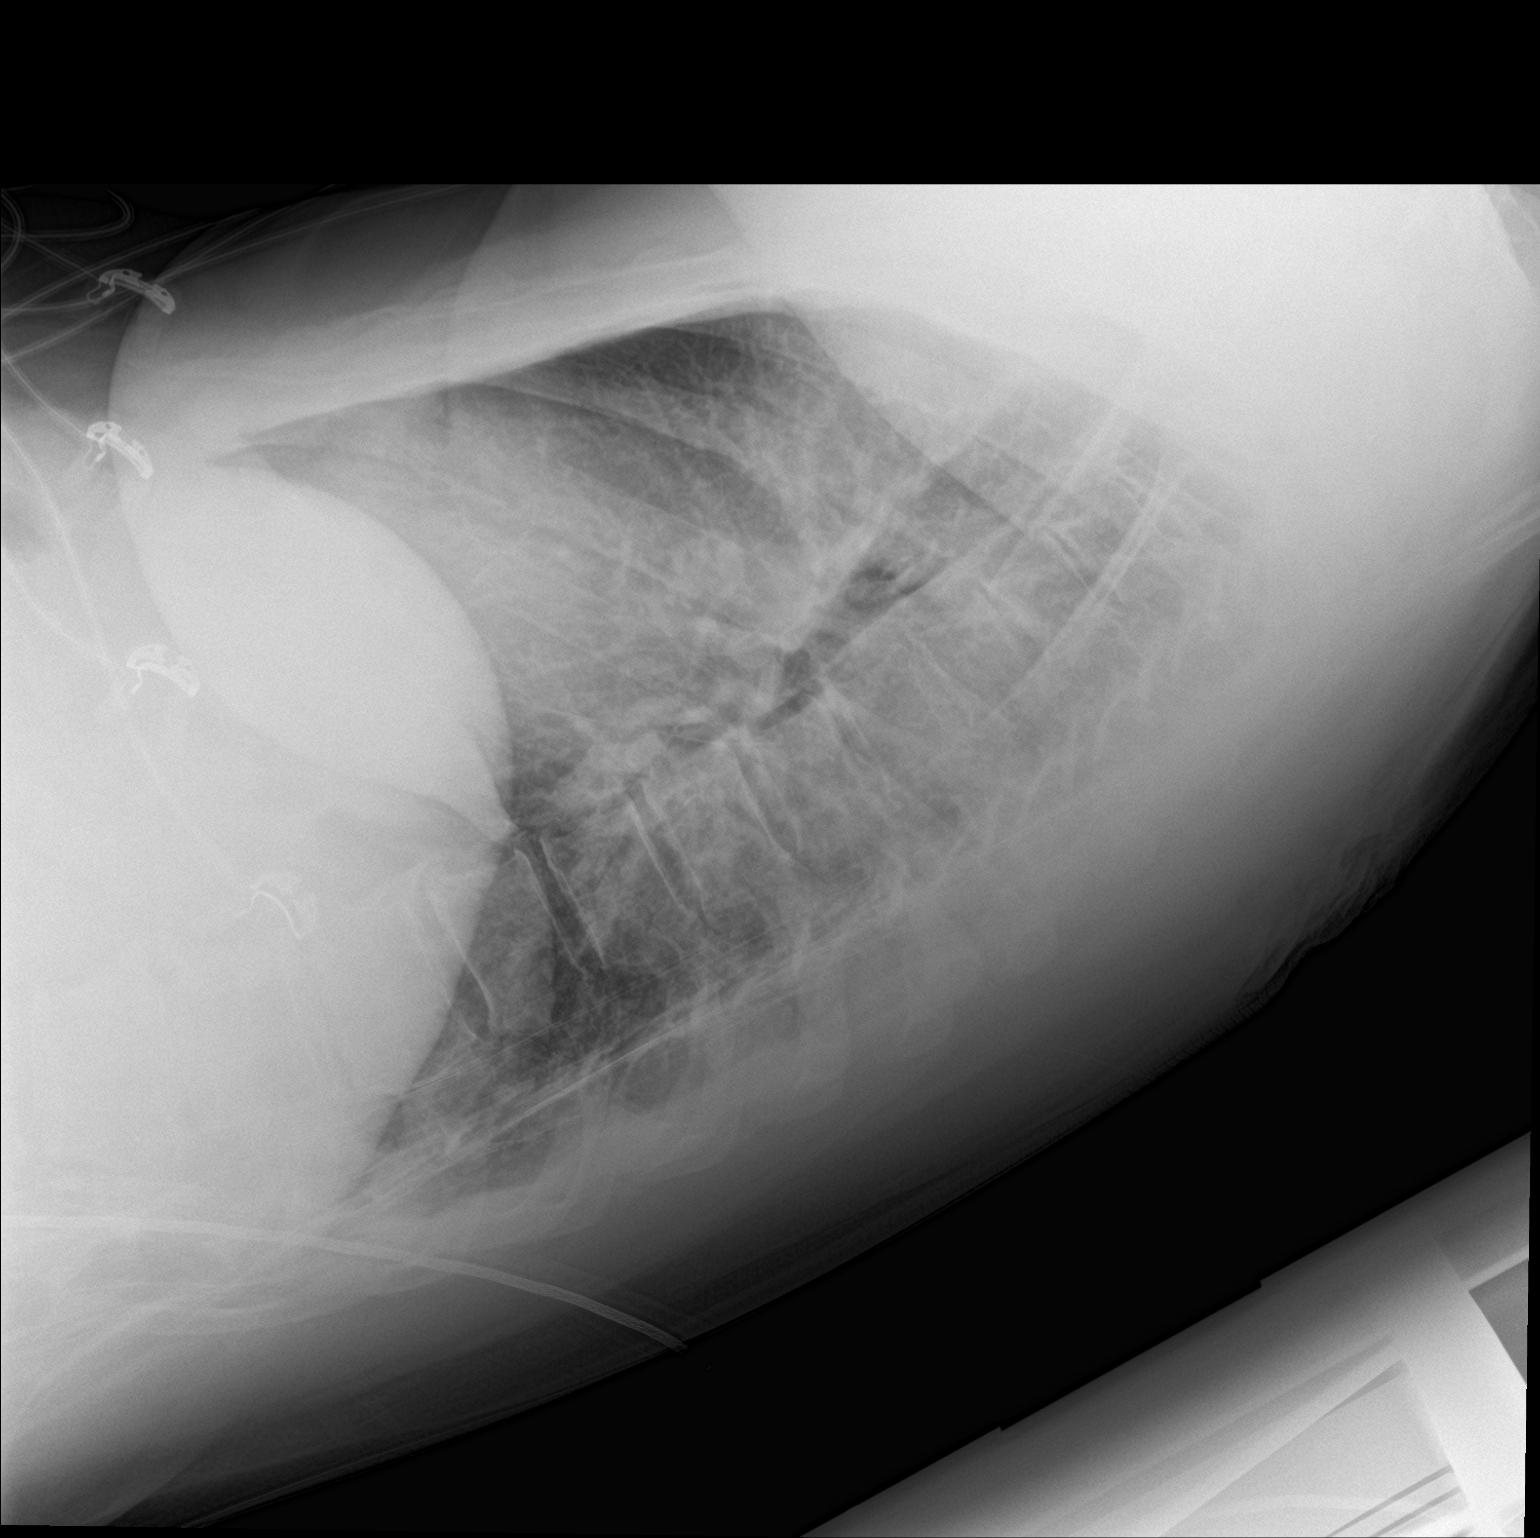

[chest ap]
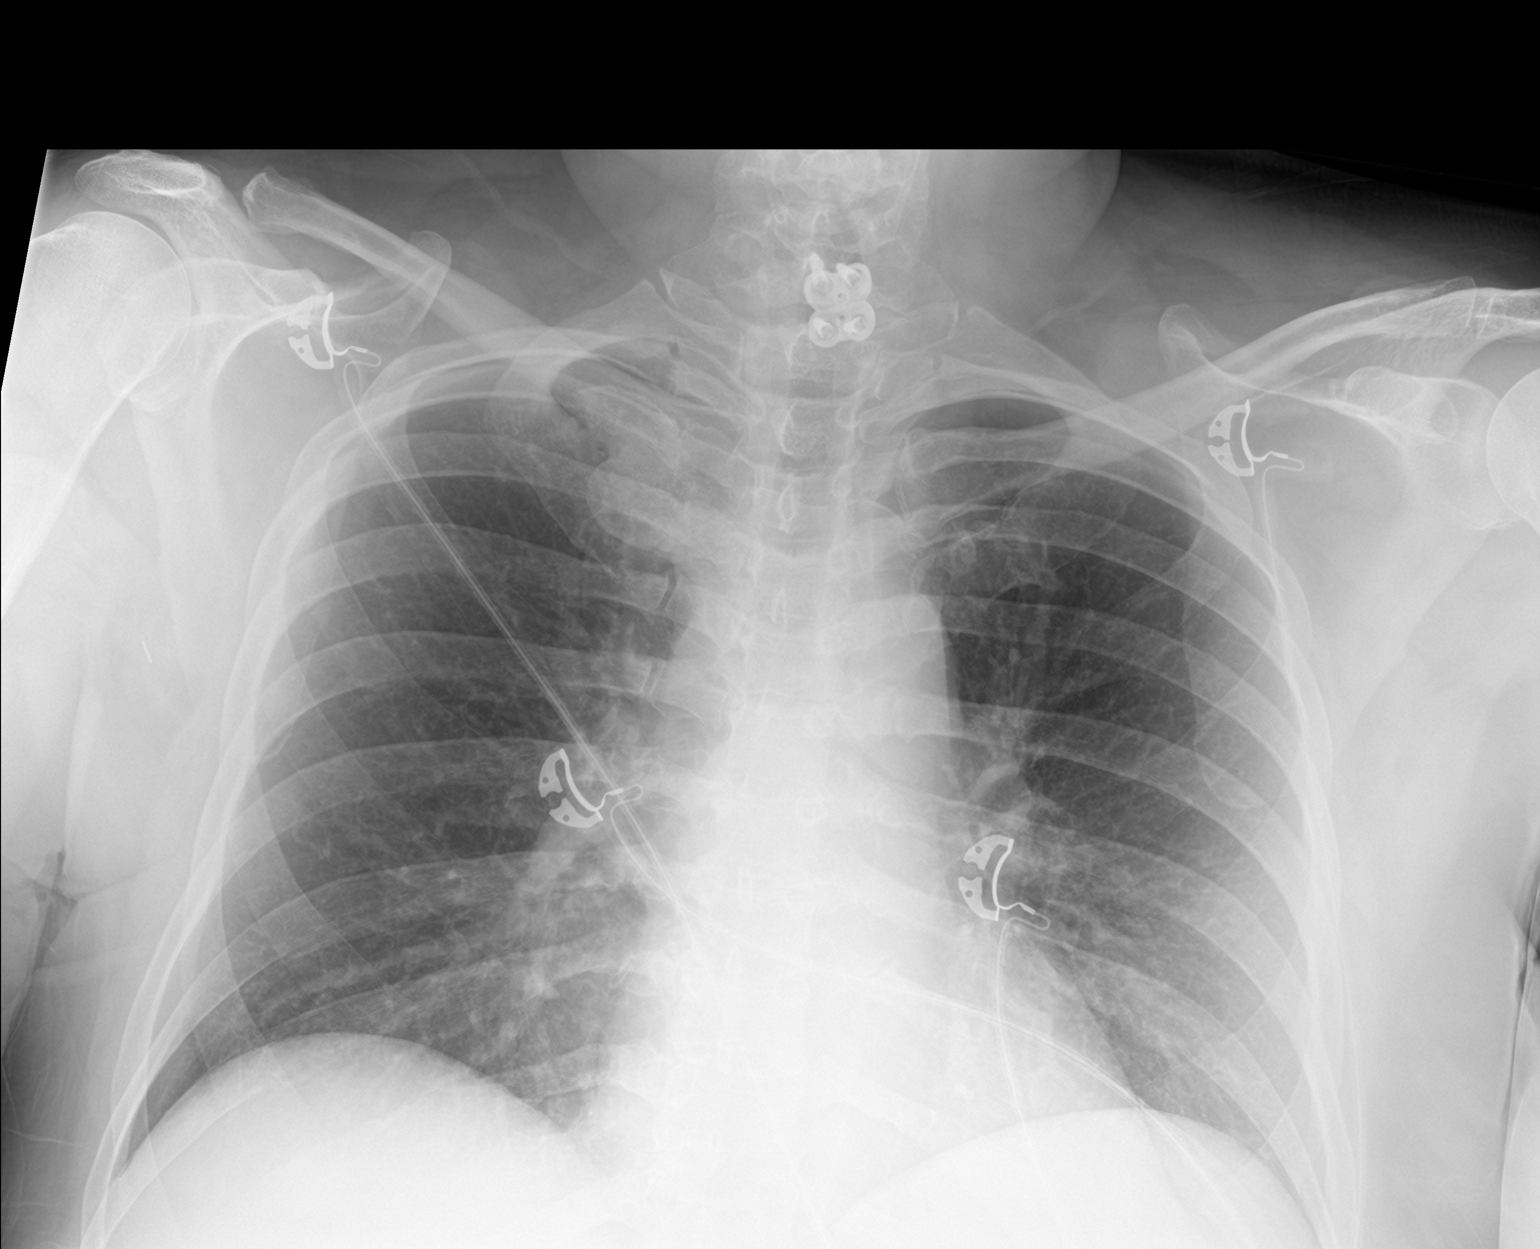

[2 of 2 positions shown; findings below may reference images not displayed]

FINDINGS: The heart size and mediastinal contours are within normal limits.
Both lungs are clear. The visualized skeletal structures are
unremarkable.

Trachea is midline. No large effusion or pneumothorax. Previous
lower cervical fusion. Chronic osseous changes over the right upper
chest.
IMPRESSION: No active cardiopulmonary disease.

## 2018-10-25 ENCOUNTER — Encounter: Payer: Self-pay | Admitting: Gastroenterology

## 2018-11-15 ENCOUNTER — Ambulatory Visit: Payer: BLUE CROSS/BLUE SHIELD | Admitting: Gastroenterology

## 2018-11-29 ENCOUNTER — Telehealth: Payer: Self-pay

## 2018-11-29 ENCOUNTER — Ambulatory Visit (INDEPENDENT_AMBULATORY_CARE_PROVIDER_SITE_OTHER): Payer: BLUE CROSS/BLUE SHIELD | Admitting: Gastroenterology

## 2018-11-29 ENCOUNTER — Other Ambulatory Visit: Payer: Self-pay

## 2018-11-29 ENCOUNTER — Encounter: Payer: Self-pay | Admitting: Gastroenterology

## 2018-11-29 DIAGNOSIS — K625 Hemorrhage of anus and rectum: Secondary | ICD-10-CM | POA: Diagnosis not present

## 2018-11-29 NOTE — Patient Instructions (Addendum)
DRINK WATER TO KEEP YOUR URINE LIGHT YELLOW.  FOLLOW A HIGH FIBER DIET. Marland KitchenAVOID ITEMS THAT CAUSE BLOATING & GAS. SEE INFO BELOW.  USE PREPARATION H UP TO 4 TIMES A DAY WHEN NEEDED FOR RECTAL BLEEDING, ITCHING, PRESSURE, BURNING, OR PAIN.   COMPLETE COLONOSCOPY. YOU MAY BRING THE ENEMA TO ADMINISTER IN THE PREOP AREA.   FOLLOW UP IN THE OFFICE WILL BE SCHEDULED IF NEEDED AFTER ENDOSCOPY.   High-Fiber Diet A high-fiber diet changes your normal diet to include more whole grains, legumes, fruits, and vegetables. Changes in the diet involve replacing refined carbohydrates with unrefined foods. The calorie level of the diet is essentially unchanged. The Dietary Reference Intake (recommended amount) for adult males is 38 grams per day. For adult females, it is 25 grams per day. Pregnant and lactating women should consume 28 grams of fiber per day. Fiber is the intact part of a plant that is not broken down during digestion. Functional fiber is fiber that has been isolated from the plant to provide a beneficial effect in the body.  PURPOSE  Increase stool bulk.   Ease and regulate bowel movements.   Lower cholesterol.   REDUCE RISK OF COLON CANCER  INDICATIONS THAT YOU NEED MORE FIBER  Constipation and hemorrhoids.   Uncomplicated diverticulosis (intestine condition) and irritable bowel syndrome.   Weight management.   As a protective measure against hardening of the arteries (atherosclerosis), diabetes, and cancer.   GUIDELINES FOR INCREASING FIBER IN THE DIET  Start adding fiber to the diet slowly. A gradual increase of about 5 more grams (2 slices of whole-wheat bread, 2 servings of most fruits or vegetables, or 1 bowl of high-fiber cereal) per day is best. Too rapid an increase in fiber may result in constipation, flatulence, and bloating.   Drink enough water and fluids to keep your urine clear or pale yellow. Water, juice, or caffeine-free drinks are recommended. Not drinking  enough fluid may cause constipation.   Eat a variety of high-fiber foods rather than one type of fiber.   Try to increase your intake of fiber through using high-fiber foods rather than fiber pills or supplements that contain small amounts of fiber.   The goal is to change the types of food eaten. Do not supplement your present diet with high-fiber foods, but replace foods in your present diet.    INCLUDE A VARIETY OF FIBER SOURCES  Replace refined and processed grains with whole grains, canned fruits with fresh fruits, and incorporate other fiber sources. White rice, white breads, and most bakery goods contain little or no fiber.   Brown whole-grain rice, buckwheat oats, and many fruits and vegetables are all good sources of fiber. These include: broccoli, Brussels sprouts, cabbage, cauliflower, beets, sweet potatoes, white potatoes (skin on), carrots, tomatoes, eggplant, squash, berries, fresh fruits, and dried fruits.   Cereals appear to be the richest source of fiber. Cereal fiber is found in whole grains and bran. Bran is the fiber-rich outer coat of cereal grain, which is largely removed in refining. In whole-grain cereals, the bran remains. In breakfast cereals, the largest amount of fiber is found in those with "bran" in their names. The fiber content is sometimes indicated on the label.   You may need to include additional fruits and vegetables each day.   In baking, for 1 cup white flour, you may use the following substitutions:   1 cup whole-wheat flour minus 2 tablespoons.   1/2 cup white flour plus 1/2 cup whole-wheat  flour.

## 2018-11-29 NOTE — Telephone Encounter (Signed)
Tried to call pt to schedule TCS w/SLF, no answer, LMOVM for return call.

## 2018-11-29 NOTE — Telephone Encounter (Signed)
Pt called office back and LMOVM. Tried to call pt, LMOVM.

## 2018-11-29 NOTE — Progress Notes (Signed)
Subjective:    Patient ID: Amy Douglas, female    DOB: Jan 16, 1971, 48 y.o.   MRN: [id number]  Primary Care Physician:  [name], MD  Primary GI:  Barney Drain, MD   Patient Location: home   Provider Location: [facility] office   Reason for Visit: HEMORRHOIDS   Persons present on the virtual encounter, with roles: patient, myself (provider), MARTINA BOOTH CMA (update meds/allergies)   Total time (minutes) spent on medical discussion: 30 MINUTES   Due to COVID-19, visit was VIA TELEPHONE VISIT DUE TO COVID 19. VISIT IS CONDUCTED VIRTUALLY AND WAS REQUESTED BY PATIENT.   Virtual Visit via TELEPHONE   I connected with  Amy Douglas and verified that I am speaking with the correct person using two identifiers.   I discussed the limitations, risks, security and privacy concerns of performing an evaluation and management service by telephone/video and the availability of in person appointments. I also discussed with the patient that there may be a patient responsible charge related to this service. The patient expressed understanding and agreed to proceed.   HPI 10-15 YRS AGO DIAGNOSED WITH IBS. HAD STAGE II BREAST CANCER 3 YRS AGO. HAS HEMORRHOIDS REAL BAD. WONDERING IF GUIDELINE FOR COLONOSCOPIES HAVE CHANGED. NO KNOWN COLON CANCER OR POLYPS. USED TO SMOKE BUT QUIT IN 2002(1-1.5 PKS/DAY). RARE ETOH. GOODY POWDERS: 1-2 A WEEK. WEIGHT: 180 LBS, LOST WEIGHT SINCE DEC 2019 SINCE STRESS OF HUSBAND'S ILLNESS. BMs: Q1-2 DAYS SINCE CHEMO(LOOSE/NORMAL/WATERY). GETS URNING SENSATION IN HER BUTTOCK AND SOME DAYS IT'S RED BLOOD ON THE TISSUE BUT NOT EVERY TIME BUT 1-2X/MON. NAUSEA: QUITE A BIT. HEARTBURN: SINCE SINUS INFECTION AND IT HAS FLARED UP ACID REFLUX AND NOW BACK ON OMEPRAZOLE. ON AND OFF OMEPRAZOLE. HAD PAIN WITH SWALLOWING AFTER DOXYCYLINE ABUT NOW IT'S GONE. 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  PT DENIES FEVER, CHILLS, HEMATEMESIS, nausea, vomiting, melena, diarrhea, CHEST PAIN,  SHORTNESS OF BREATH, PROBLEMS WITH SEDATION/ANESTHESIA.OR CHANGE IN BOWEL IN HABITS.  Past Medical History:  Diagnosis Date  . Breast cancer ([place])   . Pneumonia    Past Surgical History:  Procedure Laterality Date  . MASTECTOMY    . maxillofacial Bilateral    Allergies  Allergen Reactions  . Hydroxyzine Pamoate Itching  . Amoxicillin-Pot Clavulanate Rash    Augmentin   . Latex Other (See Comments)    Rash and skin blistering  . Other Other (See Comments)    States she has a bad reaction to all steroids: fever, insomnia, ams  . Diclofenac     Upsets stomach  . Levaquin [Levofloxacin] Itching  . Prednisone     AMS  . Vistaril [Hydroxyzine Hcl]     Lip Swelling   . Lamotrigine Other (See Comments)    Lowers wbc, fever   Current Outpatient Medications  Medication Sig    . acetaminophen (TYLENOL) 325 MG tablet Take 325-650 mg by mouth every 6 (six) hours as needed for mild pain.     Marland Kitchen omeprazole (PRILOSEC) 20 MG capsule Take 20 mg by mouth daily.     . sertraline (ZOLOFT) 100 MG tablet Take 1 tablet (100 mg total) by mouth daily.    .      .      .       Family History  Problem Relation Age of Onset  . Hypertension Mother   . Diabetes Father   . Dementia Father   . Heart disease Father   . Colon cancer Neg Hx   .  Colon polyps Neg Hx     Social History   Socioeconomic History  . Marital status: Married    Spouse name: Not on file  . Number of children: Not on file  . Years of education: Not on file  . Highest education level: Not on file  Occupational History  . Not on file  Social Needs  . Financial resource strain: Not on file  . Food insecurity:    Worry: Not on file    Inability: Not on file  . Transportation needs:    Medical: Not on file    Non-medical: Not on file  Tobacco Use  . Smoking status: Former Smoker    Years: 12.00    Types: Cigarettes    Last attempt to quit: 06/30/2001    Years since quitting: 17.4  . Smokeless tobacco: Never Used   Substance and Sexual Activity  . Alcohol use: No  . Drug use: No  . Sexual activity: Yes  Lifestyle  . Physical activity:    Days per week: Not on file    Minutes per session: Not on file  . Stress: Not on file  Relationships  . Social connections:    Talks on phone: Not on file    Gets together: Not on file    Attends religious service: Not on file    Active member of club or organization: Not on file    Attends meetings of clubs or organizations: Not on file    Relationship status: Not on file  Other Topics Concern  . 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    Review of Systems PER HPI OTHERWISE ALL SYSTEMS ARE NEGATIVE.    Objective:   Physical Exam  TELEPHONE VISIT DUE TO COVID 19, VISIT IS CONDUCTED VIRTUALLY AND WAS REQUESTED BY PATIENT.     Assessment & Plan:

## 2018-11-29 NOTE — Assessment & Plan Note (Signed)
SYMPTOMS NOT IDEALLY CONTROLLED. DIFFERENTIAL DIAGNOSIS INCLUDES HEMORRHOIDS, COLON POLYPS, AVMs, & LESS LIKELY COLON CA.  DRINK WATER TO KEEP YOUR URINE LIGHT YELLOW. FOLLOW A HIGH FIBER DIET. Marland KitchenAVOID ITEMS THAT CAUSE BLOATING & GAS.  HANDOUT GIVEN. USE PREPARATION H UP TO 4 TIMES A DAY WHEN NEEDED FOR RECTAL BLEEDING, ITCHING, PRESSURE, BURNING, OR PAIN. COMPLETE COLONOSCOPY. YOU MAY BRING THE ENEMA TO ADMINISTER IN THE PREOP AREA. DISCUSSED PROCEDURE, BENEFITS, & RISKS: < 1% chance of medication reaction, bleeding, perforation, ASPIRATION, or rupture of spleen/liver requiring surgery to fix it and missed polyps < 1 cm 10-20% of the time. FOLLOW UP IN THE OFFICE WILL BE SCHEDULED IF NEEDED AFTER ENDOSCOPY.

## 2018-12-03 ENCOUNTER — Other Ambulatory Visit: Payer: Self-pay

## 2018-12-03 DIAGNOSIS — K625 Hemorrhage of anus and rectum: Secondary | ICD-10-CM

## 2018-12-03 MED ORDER — NA SULFATE-K SULFATE-MG SULF 17.5-3.13-1.6 GM/177ML PO SOLN: 1.0000 | ORAL | 0 refills | Status: DC

## 2018-12-03 NOTE — Telephone Encounter (Signed)
Called pt, TCS w/SLF scheduled for 01/18/19 at 2:00pm. Rx for prep sent to pharmacy. Orders entered. Instructions mailed.

## 2018-12-03 NOTE — Progress Notes (Signed)
cc'd to pcp 

## 2019-01-02 ENCOUNTER — Telehealth: Payer: Self-pay | Admitting: *Deleted

## 2019-01-02 NOTE — Telephone Encounter (Signed)
LMOVM for pt at both #'s listed

## 2019-01-03 NOTE — Telephone Encounter (Signed)
[place] for pt

## 2019-01-07 NOTE — Telephone Encounter (Signed)
LMOVM for pt 

## 2019-01-08 NOTE — Telephone Encounter (Signed)
Called patient, mobile # VM full, called alternate # and LMOVM, called spouse # listed and LMOVM

## 2019-01-08 NOTE — Telephone Encounter (Signed)
Patient called back. She is scheduled for COVID-19 testing 7/15 at 10:00am. Pt aware of location for testing. Aware she will also need to remain quarantined after testing until procedure is done. She voiced understanding

## 2019-01-11 ENCOUNTER — Telehealth: Payer: Self-pay | Admitting: Gastroenterology

## 2019-01-11 NOTE — Telephone Encounter (Signed)
Called pt, TCS w/SLF rescheduled to 01/24/19 at 9:15am. COVID test rescheduled to 01/22/19 at 9:05am. Pt aware to quarantine at home after test until procedure. Endo scheduler informed. Appt letter and new instructions mailed to pt.

## 2019-01-11 NOTE — Telephone Encounter (Signed)
Pt is scheduled for next Friday with SF. Pt said due to her work schedule she would have to reschedule. Please call [phone]

## 2019-01-16 ENCOUNTER — Other Ambulatory Visit (HOSPITAL_COMMUNITY): Payer: BLUE CROSS/BLUE SHIELD

## 2019-01-22 ENCOUNTER — Other Ambulatory Visit: Payer: Self-pay

## 2019-01-22 ENCOUNTER — Other Ambulatory Visit (HOSPITAL_COMMUNITY)
Admission: RE | Admit: 2019-01-22 | Discharge: 2019-01-22 | Disposition: A | Discharge status: Home / Self Care | Payer: BC Managed Care – PPO | Source: Ambulatory Visit | Attending: Gastroenterology | Admitting: Gastroenterology

## 2019-01-22 DIAGNOSIS — Z1159 Encounter for screening for other viral diseases: Secondary | ICD-10-CM | POA: Insufficient documentation

## 2019-01-22 LAB — SARS CORONAVIRUS 2 (TAT 6-24 HRS): SARS Coronavirus 2: NEGATIVE

## 2019-01-24 ENCOUNTER — Encounter (HOSPITAL_COMMUNITY)
Admission: RE | Disposition: A | Discharge status: Home / Self Care | Payer: Self-pay | Source: Home / Self Care | Attending: Gastroenterology

## 2019-01-24 ENCOUNTER — Ambulatory Visit (HOSPITAL_COMMUNITY)
Admission: RE | Admit: 2019-01-24 | Discharge: 2019-01-24 | Disposition: A | Discharge status: Home / Self Care | Payer: BC Managed Care – PPO | Attending: Gastroenterology | Admitting: Gastroenterology

## 2019-01-24 ENCOUNTER — Other Ambulatory Visit: Payer: Self-pay

## 2019-01-24 ENCOUNTER — Encounter (HOSPITAL_COMMUNITY): Payer: Self-pay

## 2019-01-24 DIAGNOSIS — K625 Hemorrhage of anus and rectum: Secondary | ICD-10-CM

## 2019-01-24 DIAGNOSIS — Q439 Congenital malformation of intestine, unspecified: Secondary | ICD-10-CM | POA: Diagnosis not present

## 2019-01-24 DIAGNOSIS — Z853 Personal history of malignant neoplasm of breast: Secondary | ICD-10-CM | POA: Insufficient documentation

## 2019-01-24 DIAGNOSIS — Z9010 Acquired absence of unspecified breast and nipple: Secondary | ICD-10-CM | POA: Diagnosis not present

## 2019-01-24 DIAGNOSIS — K921 Melena: Secondary | ICD-10-CM | POA: Diagnosis not present

## 2019-01-24 DIAGNOSIS — K635 Polyp of colon: Secondary | ICD-10-CM

## 2019-01-24 DIAGNOSIS — K648 Other hemorrhoids: Secondary | ICD-10-CM | POA: Diagnosis not present

## 2019-01-24 DIAGNOSIS — D123 Benign neoplasm of transverse colon: Secondary | ICD-10-CM | POA: Diagnosis not present

## 2019-01-24 DIAGNOSIS — Z87891 Personal history of nicotine dependence: Secondary | ICD-10-CM | POA: Diagnosis not present

## 2019-01-24 DIAGNOSIS — K644 Residual hemorrhoidal skin tags: Secondary | ICD-10-CM | POA: Insufficient documentation

## 2019-01-24 HISTORY — PX: COLONOSCOPY: SHX5424

## 2019-01-24 HISTORY — PX: POLYPECTOMY: SHX5525

## 2019-01-24 SURGERY — COLONOSCOPY
Anesthesia: Moderate Sedation

## 2019-01-24 MED ORDER — SODIUM CHLORIDE 0.9 % IV SOLN
INTRAVENOUS | Status: DC
  Administered 2019-01-24: 09:00:00 via INTRAVENOUS

## 2019-01-24 MED ORDER — MIDAZOLAM HCL 5 MG/5ML IJ SOLN
INTRAMUSCULAR | Status: AC
  Filled 2019-01-24: qty 10

## 2019-01-24 MED ORDER — MEPERIDINE HCL 100 MG/ML IJ SOLN
INTRAMUSCULAR | Status: DC | PRN
  Administered 2019-01-24: 25 mg via INTRAVENOUS
  Administered 2019-01-24: 50 mg via INTRAVENOUS

## 2019-01-24 MED ORDER — MEPERIDINE HCL 100 MG/ML IJ SOLN
INTRAMUSCULAR | Status: AC
  Filled 2019-01-24: qty 2

## 2019-01-24 MED ORDER — MIDAZOLAM HCL 5 MG/5ML IJ SOLN
INTRAMUSCULAR | Status: DC | PRN
  Administered 2019-01-24: 2 mg via INTRAVENOUS
  Administered 2019-01-24: 1 mg via INTRAVENOUS
  Administered 2019-01-24: 2 mg via INTRAVENOUS

## 2019-01-24 NOTE — Op Note (Signed)
[facility] Patient Name: Amy Douglas Procedure Date: 01/24/2019 9:28 AM MRN: [id number] Date of Birth: 1971-05-23 Attending MD: Barney Drain MD, MD CSN: [id number] Age: 48 Admit Type: Outpatient Procedure:                Colonoscopy WITH COLD SNARE POLYPECTOMY Indications:              Hematochezia Providers:                Barney Drain MD, MD, Charlsie Quest. Theda Sers RN, RN,                            Nelma Rothman, Technician Referring MD:             [name] Medicines:                Meperidine 75 mg IV, Midazolam 5 mg IV Complications:            No immediate complications. Estimated Blood Loss:     Estimated blood loss was minimal. Procedure:                Pre-Anesthesia Assessment:                           - Prior to the procedure, a History and Physical                            was performed, and patient medications and                            allergies were reviewed. The patient's tolerance of                            previous anesthesia was also reviewed. The risks                            and benefits of the procedure and the sedation                            options and risks were discussed with the patient.                            All questions were answered, and informed consent                            was obtained. Prior Anticoagulants: The patient has                            taken no previous anticoagulant or antiplatelet                            agents except for aspirin. ASA Grade Assessment: II                            - A patient with mild systemic disease. After  reviewing the risks and benefits, the patient was                            deemed in satisfactory condition to undergo the                            procedure.                           After obtaining informed consent, the colonoscope                            was passed under direct vision. Throughout the                            procedure,  the patient's blood pressure, pulse, and                            oxygen saturations were monitored continuously. The                            [id number]-[id number] ([id number]) scope was introduced through                            the anus and advanced to the 10 cm into the ileum.                            The colonoscopy was somewhat difficult due to a                            tortuous colon. Successful completion of the                            procedure was aided by straightening and shortening                            the scope to obtain bowel loop reduction and                            COLOWRAP. The patient tolerated the procedure well.                            The quality of the bowel preparation was excellent.                            The terminal ileum, ileocecal valve, appendiceal                            orifice, and rectum were photographed. Scope In: 9:55:31 AM Scope Out: 10:12:45 AM Scope Withdrawal Time: 0 hours 14 minutes 38 seconds  Total Procedure Duration: 0 hours 17 minutes 14 seconds  Findings:      The terminal ileum appeared normal.      A 6 mm polyp was found in the distal transverse  colon. The polyp was       sessile. The polyp was removed with a cold snare. Resection and       retrieval were complete.      External and internal hemorrhoids were found.      The recto-sigmoid colon and sigmoid colon were mildly tortuous. Impression:               - The examined portion of the ileum was normal.                           - One 6 mm polyp in the distal transverse colon,                            removed with a cold snare. Resected and retrieved.                           - External and internal hemorrhoids.                           - Tortuous LEFT colon. Moderate Sedation:      Moderate (conscious) sedation was administered by the endoscopy nurse       and supervised by the endoscopist. The following parameters were       monitored: oxygen saturation,  heart rate, blood pressure, and response       to care. Total physician intraservice time was 32 minutes. Recommendation:           - Patient has a contact number available for                            emergencies. The signs and symptoms of potential                            delayed complications were discussed with the                            patient. Return to normal activities tomorrow.                            Written discharge instructions were provided to the                            patient.                           - High fiber diet. FOLLOW RECOMMENDATION OF DR.                            MARK HYMAN, "THE 10 DAY DETOX DIET".                           - Continue present medications.                           - Await pathology results.                           -  Repeat colonoscopy in 5-10 years for surveillance.                           - Return to GI office in 6 months. Procedure Code(s):        --- Professional ---                           [id number], Colonoscopy, flexible; with removal of                            tumor(s), polyp(s), or other lesion(s) by snare                            technique                           99153, Moderate sedation; each additional 15                            minutes intraservice time                           G0500, Moderate sedation services provided by the                            same physician or other qualified health care                            professional performing a gastrointestinal                            endoscopic service that sedation supports,                            requiring the presence of an independent trained                            observer to assist in the monitoring of the                            patient's level of consciousness and physiological                            status; initial 15 minutes of intra-service time;                            patient age 32 years or older (additional  time may                            be reported with [phone], as appropriate) Diagnosis Code(s):        --- Professional ---                           K64.8, Other hemorrhoids  K63.5, Polyp of colon                           K92.1, Melena (includes Hematochezia)                           Q43.8, Other specified congenital malformations of                            intestine CPT copyright 2019 American Medical Association. All rights reserved. The codes documented in this report are preliminary and upon coder review may  be revised to meet current compliance requirements. Barney Drain, MD Barney Drain MD, MD 01/24/2019 10:25:07 AM This report has been signed electronically. Number of Addenda: 0

## 2019-01-24 NOTE — Progress Notes (Signed)
Pt placed on nasal auto CPAP for procedure in  ENDO.  O2 sats 100%. Pt tolerating well at this time. RT will continue to monitor.

## 2019-01-24 NOTE — H&P (Signed)
Primary Care Physician:  [name], MD Primary Gastroenterologist:  Dr. Oneida Alar  Pre-Procedure History & Physical: HPI:  Amy Douglas is a 48 y.o. female here for  BRBPR.  Past Medical History:  Diagnosis Date  . Breast cancer ([place])   . Pneumonia     Past Surgical History:  Procedure Laterality Date  . MASTECTOMY    . maxillofacial Bilateral     Prior to Admission medications   Medication Sig Start Date End Date Taking? Authorizing Provider  Aspirin-Acetaminophen-Caffeine (GOODYS EXTRA STRENGTH PO) Take 1 packet by mouth 2 (two) times daily as needed (pain.).   Yes [provider]  Na Sulfate-K Sulfate-Mg Sulf (SUPREP BOWEL PREP KIT) 17.5-3.13-1.6 GM/177ML SOLN Take 1 kit by mouth as directed. 12/03/18  Yes Izzac Rockett L, MD  sertraline (ZOLOFT) 100 MG tablet Take 1 tablet (100 mg total) by mouth daily. 03/28/17  Yes [name], MD    Allergies as of 12/03/2018 - Review Complete 11/29/2018  Allergen Reaction Noted  . Hydroxyzine pamoate Itching 03/30/2015  . Amoxicillin-pot clavulanate Rash 03/30/2015  . Latex Other (See Comments) 04/30/2015  . Other Other (See Comments) 03/31/2015  . Diclofenac  10/17/2017  . Levaquin [levofloxacin] Itching 03/05/2018  . Prednisone  10/17/2017  . Vistaril [hydroxyzine hcl]  03/02/2018  . Lamotrigine Other (See Comments) 03/02/2018    Family History  Problem Relation Age of Onset  . Hypertension Mother   . Diabetes Father   . Dementia Father   . Heart disease Father   . Colon cancer Neg Hx   . Colon polyps Neg Hx     Social History   Socioeconomic History  . Marital status: Married    Spouse name: Not on file  . Number of children: Not on file  . Years of education: Not on file  . Highest education level: Not on file  Occupational History  . Not on file  Social Needs  . Financial resource strain: Not on file  . Food insecurity    Worry: Not on file    Inability: Not on file  . Transportation needs   Medical: Not on file    Non-medical: Not on file  Tobacco Use  . Smoking status: Former Smoker    Years: 12.00    Types: Cigarettes    Quit date: 06/30/2001    Years since quitting: 17.5  . Smokeless tobacco: Never Used  Substance and Sexual Activity  . Alcohol use: No  . Drug use: No  . Sexual activity: Yes  Lifestyle  . Physical activity    Days per week: Not on file    Minutes per session: Not on file  . Stress: Not on file  Relationships  . Social Herbalist on phone: Not on file    Gets together: Not on file    Attends religious service: Not on file    Active member of club or organization: Not on file    Attends meetings of clubs or organizations: Not on file    Relationship status: Not on file  . Intimate partner violence    Fear of current or ex partner: Not on file    Emotionally abused: Not on file    Physically abused: Not on file    Forced sexual activity: Not on file  Other Topics Concern  . 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    Review of Systems: See HPI,  otherwise negative ROS   Physical Exam: BP 130/65   Pulse 83   Temp 98.6 F (37 C) (Oral)   Resp 12   Ht 5' 8" (1.727 m)   Wt 81.2 kg   LMP 01/21/2019   SpO2 100%   BMI 27.22 kg/m  General:   Alert,  pleasant and cooperative in NAD Head:  Normocephalic and atraumatic. Neck:  Supple; Lungs:  Clear throughout to auscultation.    Heart:  Regular rate and rhythm. Abdomen:  Soft, nontender and nondistended. Normal bowel sounds, without guarding, and without rebound.   Neurologic:  Alert and  oriented x4;  grossly normal neurologically.  Impression/Plan:    BRBPR  PLAN: TCS TODAY. DISCUSSED PROCEDURE, BENEFITS, & RISKS: < 1% chance of medication reaction, bleeding, perforation, ASPIRATION, or rupture of spleen/liver requiring surgery to fix it and missed polyps < 1 cm 10-20% of the time.

## 2019-01-24 NOTE — Discharge Instructions (Signed)
You had 1 polyp removed. YouR RECTAL BLEEDING IS MOST LIKELY DUE TO internal hemorrhoids.THEY WILL MORE LIKELY BLEED DUE TO GOODY POWDERS  AND CONSTIPATION.    DRINK WATER TO KEEP YOUR URINE LIGHT YELLOW.  Do not drink SODA, ENERGY DRINKS OR DIET SODA. AVOID HIGH FRUCTOSE CORN SYRUP. DO NOT chew SUGAR FREE GUM, OR USE ARTIFICIAL SWEETENERS. USE STEVIA AS A SWEETENER.   To reduce constipation and lose weight:     1. CONTINUE YOUR WEIGHT LOSS EFFORTS.  I RECOMMEND YOU READ AND FOLLOW RECOMMENDATIONS BY DR. MARK HYMAN, "10-DAY DETOX DIET". FOLLOW A HIGH FIBER DIET. AVOID ITEMS THAT CAUSE BLOATING & GAS. SEE INFO BELOW.    2. USE PHILLIPS MILK OF MAGNESIA PILLS OR LIQUID ONE TO THREE TIMES A DAY.   USE PREPARATION H FOUR TIMES  A DAY IF NEEDED TO RELIEVE RECTAL PAIN/PRESSURE/BLEEDING.   YOUR BIOPSY RESULTS WILL BE BACK IN 5 BUSINESS DAYS.  FOLLOW UP IN 6 MOS.   Next colonoscopy in 5-10 years.    Colonoscopy Care After Read the instructions outlined below and refer to this sheet in the next week. These discharge instructions provide you with general information on caring for yourself after you leave the hospital. While your treatment has been planned according to the most current medical practices available, unavoidable complications occasionally occur. If you have any problems or questions after discharge, call DR. Patty Leitzke, [phone].  ACTIVITY  You may resume your regular activity, but move at a slower pace for the next 24 hours.   Take frequent rest periods for the next 24 hours.   Walking will help get rid of the air and reduce the bloated feeling in your belly (abdomen).   No driving for 24 hours (because of the medicine (anesthesia) used during the test).   You may shower.   Do not sign any important legal documents or operate any machinery for 24 hours (because of the anesthesia used during the test).    NUTRITION  Drink plenty of fluids.   You may resume your  normal diet as instructed by your doctor.   Begin with a light meal and progress to your normal diet. Heavy or fried foods are harder to digest and may make you feel sick to your stomach (nauseated).   Avoid alcoholic beverages for 24 hours or as instructed.    MEDICATIONS  You may resume your normal medications.   WHAT YOU CAN EXPECT TODAY  Some feelings of bloating in the abdomen.   Passage of more gas than usual.   Spotting of blood in your stool or on the toilet paper  .  IF YOU HAD POLYPS REMOVED DURING THE COLONOSCOPY:  Eat a soft diet IF YOU HAVE NAUSEA, BLOATING, ABDOMINAL PAIN, OR VOMITING.    FINDING OUT THE RESULTS OF YOUR TEST Not all test results are available during your visit. DR. Oneida Alar WILL CALL YOU WITHIN 14 DAYS OF YOUR PROCEDUE WITH YOUR RESULTS. Do not assume everything is normal if you have not heard from DR. Wynetta Seith, CALL HER OFFICE AT [phone].  SEEK IMMEDIATE MEDICAL ATTENTION AND CALL THE OFFICE: [phone] IF:  You have more than a spotting of blood in your stool.   Your belly is swollen (abdominal distention).   You are nauseated or vomiting.   You have a temperature over 101F.   You have abdominal pain or discomfort that is severe or gets worse throughout the day.   High-Fiber Diet A high-fiber diet changes your normal diet  to include more whole grains, legumes, fruits, and vegetables. Changes in the diet involve replacing refined carbohydrates with unrefined foods. The calorie level of the diet is essentially unchanged. The Dietary Reference Intake (recommended amount) for adult males is 38 grams per day. For adult females, it is 25 grams per day. Pregnant and lactating women should consume 28 grams of fiber per day. Fiber is the intact part of a plant that is not broken down during digestion. Functional fiber is fiber that has been isolated from the plant to provide a beneficial effect in the body.  PURPOSE  Increase stool bulk.    Ease and regulate bowel movements.   Lower cholesterol.   REDUCE RISK OF COLON CANCER  INDICATIONS THAT YOU NEED MORE FIBER  Constipation and hemorrhoids.   Uncomplicated diverticulosis (intestine condition) and irritable bowel syndrome.   Weight management.   As a protective measure against hardening of the arteries (atherosclerosis), diabetes, and cancer.   GUIDELINES FOR INCREASING FIBER IN THE DIET  Start adding fiber to the diet slowly. A gradual increase of about 5 more grams (2 slices of whole-wheat bread, 2 servings of most fruits or vegetables, or 1 bowl of high-fiber cereal) per day is best. Too rapid an increase in fiber may result in constipation, flatulence, and bloating.   Drink enough water and fluids to keep your urine clear or pale yellow. Water, juice, or caffeine-free drinks are recommended. Not drinking enough fluid may cause constipation.   Eat a variety of high-fiber foods rather than one type of fiber.   Try to increase your intake of fiber through using high-fiber foods rather than fiber pills or supplements that contain small amounts of fiber.   The goal is to change the types of food eaten. Do not supplement your present diet with high-fiber foods, but replace foods in your present diet.   INCLUDE A VARIETY OF FIBER SOURCES  Replace refined and processed grains with whole grains, canned fruits with fresh fruits, and incorporate other fiber sources. White rice, white breads, and most bakery goods contain little or no fiber.   Brown whole-grain rice, buckwheat oats, and many fruits and vegetables are all good sources of fiber. These include: broccoli, Brussels sprouts, cabbage, cauliflower, beets, sweet potatoes, white potatoes (skin on), carrots, tomatoes, eggplant, squash, berries, fresh fruits, and dried fruits.   Cereals appear to be the richest source of fiber. Cereal fiber is found in whole grains and bran. Bran is the fiber-rich outer coat of  cereal grain, which is largely removed in refining. In whole-grain cereals, the bran remains. In breakfast cereals, the largest amount of fiber is found in those with "bran" in their names. The fiber content is sometimes indicated on the label.   You may need to include additional fruits and vegetables each day.   In baking, for 1 cup white flour, you may use the following substitutions:   1 cup whole-wheat flour minus 2 tablespoons.   1/2 cup white flour plus 1/2 cup whole-wheat flour.   Polyps, Colon  A polyp is extra tissue that grows inside your body. Colon polyps grow in the large intestine. The large intestine, also called the colon, is part of your digestive system. It is a long, hollow tube at the end of your digestive tract where your body makes and stores stool. Most polyps are not dangerous. They are benign. This means they are not cancerous. But over time, some types of polyps can turn into cancer. Polyps that are  smaller than a pea are usually not harmful. But larger polyps could someday become or may already be cancerous. To be safe, doctors remove all polyps and test them.   WHO GETS POLYPS? Anyone can get polyps, but certain people are more likely than others. You may have a greater chance of getting polyps if:  You are over 50.   You have had polyps before.   Someone in your family has had polyps.   Someone in your family has had cancer of the large intestine.   Find out if someone in your family has had polyps. You may also be more likely to get polyps if you:   Eat a lot of fatty foods   Smoke   Drink alcohol   Do not exercise  Eat too much   PREVENTION There is not one sure way to prevent polyps. You might be able to lower your risk of getting them if you:  Eat more fruits and vegetables and less fatty food.   Do not smoke.   Avoid alcohol.   Exercise every day.   Lose weight if you are overweight.   Eating more calcium and folate can also lower your  risk of getting polyps. Some foods that are rich in calcium are milk, cheese, and broccoli. Some foods that are rich in folate are chickpeas, kidney beans, and spinach.   Hemorrhoids Hemorrhoids are dilated (enlarged) veins around the rectum. Sometimes clots will form in the veins. This makes them swollen and painful. These are called thrombosed hemorrhoids. Causes of hemorrhoids include:  Constipation.   Straining to have a bowel movement.   HEAVY LIFTING  HOME CARE INSTRUCTIONS  Eat a well balanced diet and drink 6 to 8 glasses of water every day to avoid constipation. You may also use a bulk laxative.   Avoid straining to have bowel movements.   Keep anal area dry and clean.   Do not use a donut shaped pillow or sit on the toilet for long periods. This increases blood pooling and pain.   Move your bowels when your body has the urge; this will require less straining and will decrease pain and pressure.

## 2019-01-28 NOTE — Progress Notes (Signed)
CC'D TO PCP °

## 2019-01-28 NOTE — Progress Notes (Signed)
ON RECALL  °

## 2019-01-29 NOTE — Progress Notes (Signed)
LMOM to call.

## 2019-01-31 ENCOUNTER — Encounter (HOSPITAL_COMMUNITY): Payer: Self-pay | Admitting: Gastroenterology

## 2019-02-01 NOTE — Progress Notes (Signed)
Mailed letter to call for results.  

## 2019-02-01 NOTE — Progress Notes (Signed)
LMOM to call.

## 2019-02-18 ENCOUNTER — Telehealth: Payer: Self-pay | Admitting: Neurology

## 2019-02-18 NOTE — Telephone Encounter (Signed)
Called the pt to make aware that I had her on my wait list. She is scheduled 9/2 to see Dr Brett Fairy.  If patient calls back, we had several cancellations for Aug 20th, (sleep consults). Advised the patient to call back and we can offer earlier apt if needed.

## 2019-03-06 ENCOUNTER — Ambulatory Visit: Payer: BC Managed Care – PPO | Admitting: Neurology

## 2019-03-06 ENCOUNTER — Other Ambulatory Visit: Payer: Self-pay

## 2019-03-06 ENCOUNTER — Encounter: Payer: Self-pay | Admitting: Neurology

## 2019-03-06 VITALS — BP 132/78 | HR 105 | Temp 97.7°F | Ht 68.0 in | Wt 185.0 lb

## 2019-03-06 DIAGNOSIS — C50911 Malignant neoplasm of unspecified site of right female breast: Secondary | ICD-10-CM | POA: Diagnosis not present

## 2019-03-06 DIAGNOSIS — D61818 Other pancytopenia: Secondary | ICD-10-CM | POA: Diagnosis not present

## 2019-03-06 DIAGNOSIS — R419 Unspecified symptoms and signs involving cognitive functions and awareness: Secondary | ICD-10-CM | POA: Diagnosis not present

## 2019-03-06 DIAGNOSIS — G40109 Localization-related (focal) (partial) symptomatic epilepsy and epileptic syndromes with simple partial seizures, not intractable, without status epilepticus: Secondary | ICD-10-CM | POA: Diagnosis not present

## 2019-03-06 NOTE — Progress Notes (Signed)
Provider:  Larey Seat, MD   Referring Provider: [name], MD Primary Care Physician:  [name], MD  Chief Complaint  Patient presents with  .       Marland Kitchen         HPI:  Amy Douglas is a 48 y.o. female  Is seen here as a referral urgent visit request  from Dr. Mannie Stabile for spells.  Amy Douglas had in the past suffered a single seizure event but I attributed to the use of Ultram- Percocet. She was seizure-free from August 2019 to July 2020 when she suffered another spell- this time following a colonoscopy.   Spells. Seizure ? Not witnessed by MD, RN- after colonoscopy on her way home. She was with her son. Her son believed she was just falling asleep. She vomited and lost urinary control. no tonic clonic activity.  The anesthetic given was Versed and there is a Opyd rare possibility that this caused a seizure.  Is also possible that she may have become slightly hypoxic which will promote seizure activity.  She has had no syncope spells or near fainting spells in the last 12 months this isolated seizure was a single event has not been repeated since and she presented to the emergency room afterwards although she stated that actually that she suffered a seizure on her way home after colonoscopy and therefore no medical personnel had witnessed the event.  She had an EEG last year and she started on Lamictal which gave her side effects.   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  She felt burning and itching in the vaginal area- groin area with very low WBC.   After that she has not followed up.  She did have incontinence of urine with the episode which followed on 24 January 2019.  She had a COVID test that was negative prior to her procedure, she did not have any tonic-clonic activity she had normal body temperature she did describe however a feeling of aura and that she could tell that something was developing.   She has not been driving.  She also has now listed allergies to vistrail, augmentin, percocet, lamictal.  She wants no antiepileptic medication.   Plan: Low yield in repeated EEGs. No repeat CT or MRI was done, no ED visit. . She doesn't drive anymore since seizure.  I suggested Keppra in case there is yet a third event.     Last visit : 02-15-2018 Amy Douglas , here with her husband , states that she saw her PCP on Tuesday and they wanted her to be seen urgent.  Last sat night at work she passed out and hit the floor, but her employe was able to cradle her head- and associated with urine incontinence.  Her eyes were rolling back, open eyes, lids flutter and she convulsed- hands fisted .  These episodes started when she was 48 year old and were regular , almost weekly for a while. Amy Douglas states that prior to the event she feels a whooshing sound ( like ocean waves ) and  has a aura with nausea associated and knows to sit down. She did not have the aura before Saturday's episode and couldn't sit down.    Her father had epilepsy and nobody thought about seeking med advice - ' they didn't want me labeled" . She was not treated or officially diagnosed.   She was undergoing a lot of facial surgery  until age 45 .  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  Until [age] wks age post recent foot surgery. She had a spell and then another one on Sat night. these are the only 2 instances in 17 years.   15 February 2018, chief complaint is described above.  The patient gave some more details during her visit with Dr. Marvis Repress just last Tuesday.  3 nights ago she was at work at the [employer] she was standing in the receiving area and started to feel a little bit lightheaded or funny.  She heard again the washing ocean wave sound and a dj vu.  Everything went black and after that she cannot remember  anything else.  When she woke up again she was drenched in sweat and felt very hot she had urinary incontinence, she had bit her tongue.  Her head did not hit the floor as described above a coworker was able to cradle her, she had no jerking movements or involuntary movements before she lost awareness, at this time she had such as short aura that she could not protect herself from the fall.  She also recalls a second spell that happened just after she had foot surgery late May 2019 she was prescribed pain medication but did not think that she happened to take her birthday.  She passed out when she got to the bottom of her home stairs, her husband saw her and he stated that she got stiff and trembling her arms flexed and her hands fisted and she had also urinary incontinence with the event, her eyelids were fluttering.  She was out for approximately a minutes.  These spells happened while the patient was treated with Zoloft 100 mg which should have prevented her cardio- vagal syncope   The patient has a bilaterally cleft lip and palate,  Mucous pit in lower lip, and residual opening in the bony palate. She had bone grafted to the palate at age ( 2002) , 48.   in 2016-17 she was diagnosed with breast cancer and underwent right mastectomy, followed by chemotherapy . She had multiple orthopedic procedures. Anterior cervical disc and fusion-  3/ 2017 ( Dr. Carloyn Manner). She was diagnosed with OSA and uses a CPAP while using a full upper dental plate - this keeps her patent palatal cleft closed.     Social history -was 48 years old at birth of her son, married later in 2003. Smoker , quit 12/ 2002. ETOH - rare, caffeine :  2 pepsis and sweet iced tea, half gallon a day -     Review of Systems: Out of a complete 14 system review, the patient complains of only the following symptoms, and all other reviewed systems are negative.  Spells. Seizure ? Not witnessed by MD, RN- after colonoscopy on her way home. She was with  her son. Her sone believed she was just falling asleep. She vomited and lost urinary control. no tonic clonic activity.   Social History   Socioeconomic History  . Marital status: Married    Spouse name: Not on file  . Number of children: Not on file  . Years of education: Not on file  . Highest education level: Not on file  Occupational History  . Not on file  Social Needs  . Financial resource strain: Not on file  . Food insecurity    Worry: Not on  file    Inability: Not on file  . Transportation needs    Medical: Not on file    Non-medical: Not on file  Tobacco Use  . Smoking status: Former Smoker    Years: 12.00    Types: Cigarettes    Quit date: 06/30/2001    Years since quitting: 17.6  . Smokeless tobacco: Never Used  Substance and Sexual Activity  . Alcohol use: No  . Drug use: No  . Sexual activity: Yes  Lifestyle  . Physical activity    Days per week: Not on file    Minutes per session: Not on file  . Stress: Not on file  Relationships  . Social Herbalist on phone: Not on file    Gets together: Not on file    Attends religious service: Not on file    Active member of club or organization: Not on file    Attends meetings of clubs or organizations: Not on file    Relationship status: Not on file  . Intimate partner violence    Fear of current or ex partner: Not on file    Emotionally abused: Not on file    Physically abused: Not on file    Forced sexual activity: Not on file  Other Topics Concern  . 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 MARRIED: 1 KID(FEMALE 1994).    Family History  Problem Relation Age of Onset  . Hypertension Mother   . Diabetes Father   . Dementia Father   . Heart disease Father   . Colon cancer Neg Hx   . Colon polyps Neg Hx     Past Medical History:  Diagnosis Date  . Breast cancer ([place])   . Pneumonia     Past Surgical History:  Procedure Laterality Date  . COLONOSCOPY  N/A 01/24/2019   Procedure: COLONOSCOPY;  Surgeon: Danie Binder, MD;  Location: [facility];  Service: Endoscopy;  Laterality: N/A;  2:00pm  . MASTECTOMY    . maxillofacial Bilateral   . POLYPECTOMY  01/24/2019   Procedure: POLYPECTOMY;  Surgeon: Danie Binder, MD;  Location: [facility];  Service: Endoscopy;;    Current Outpatient Medications  Medication Sig Dispense Refill  . Aspirin-Acetaminophen-Caffeine (GOODYS EXTRA STRENGTH PO) Take 1 packet by mouth as needed (pain.).     Marland Kitchen sertraline (ZOLOFT) 100 MG tablet Take 1 tablet (100 mg total) by mouth daily. 90 tablet 1   No current facility-administered medications for this visit.    EKG last week nl, but abnormal with orthostatic BP- PCP recorded.   Allergies as of 03/06/2019 - Review Complete 03/06/2019  Allergen Reaction Noted  . Hydroxyzine pamoate Itching 03/30/2015  . Amoxicillin-pot clavulanate Rash 03/30/2015  . Latex Other (See Comments) 04/30/2015  . Other Other (See Comments) 03/31/2015  . Diclofenac  10/17/2017  . Levaquin [levofloxacin] Itching 03/05/2018  . Prednisone  10/17/2017  . Vistaril [hydroxyzine hcl]  03/02/2018  . Lamotrigine Other (See Comments) 03/02/2018    Vitals: BP 132/78   Pulse (!) 105   Temp 97.7 F (36.5 C)   Ht 5\' 8"  (1.727 m)   Wt 185 lb (83.9 kg)   BMI 28.13 kg/m     Last Weight:  Wt Readings from Last 1 Encounters:  03/06/19 185 lb (83.9 kg)   Last Height:   Ht Readings from Last 1 Encounters:  03/06/19 5\' 8"  (1.727 m)  Physical exam:  General: The patient is awake, alert and appears not in acute distress. The patient is well groomed. Head: Normocephalic, atraumatic. Neck is supple. Mallampati 3- patent Cleft- ,  neck circumference: 16. 25 '  Cardiovascular:  Regular rate and rhythm , without  murmurs or carotid bruit, and without distended neck veins. Respiratory: Lungs are clear to auscultation. Skin:  Without evidence of edema, or rash Trunk: BMI is   elevated and patient  has normal posture.  Neurologic exam : The patient is awake and alert, oriented to place and time.  Memory subjective  described as intact. There is a normal attention span & concentration ability. Speech is fluent with dysarthria, mild dysphonia not  aphasia. Mood and affect are appropriate.  Cranial nerves: Pupils are equal and briskly reactive to light. Funduscopic exam without  evidence of pallor or edema. Extraocular movements  in vertical and horizontal planes intact and without nystagmus.  Visual fields by finger perimetry are intact. Hearing to finger rub intact.  Facial sensation intact to fine touch.  Facial motor strength is symmetric- moves midline.  Tongue protrusion into either cheek is normal. Shoulder shrug is normal.   Motor exam:   Normal tone ,muscle bulk and symmetric strength in all extremities. Sensory:  Fine touch, pinprick and vibration were tested in all extremities. Proprioception was normal. Coordination: Rapid alternating movements / Finger-to-nose maneuver  normal without evidence of ataxia, dysmetria or tremor. Gait and station: Patient walks without assistive device .  Deep tendon reflexes: in the  upper and lower extremities are symmetric and intact. Babinski maneuver response is downgoing.   Assessment:  After physical and neurologic examination, review of laboratory studies, imaging, neurophysiology testing and pre-existing records, assessment is that of :    I strongly suspect a seizure event by description, rather than a syncope.  I appreciate that Dr. Marvis Repress documented the orthostatic blood pressure and heart rate, in lying position supine blood pressure was 124/72 mmHg with a pulse rate of 72/min, while seated blood pressure was 118/88 mmHg, pulse rate 76/min and standing up blood pressure became 108/68 mmHg, pulse rate 78/min.    EKG performed at the office revealed normal sinus rhythm ventricular rate of 67 bpm normal axis normal  intervals.  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  6 month of seizure freedom would allow for driving to resume,   EEG was normal.  She had not followed up after 02-2018 , she had only one of three scheduled EEGs-she had no MRI .    Asencion Partridge Ashawn Rinehart MD 03/06/2019

## 2019-03-13 ENCOUNTER — Telehealth: Payer: Self-pay | Admitting: Neurology

## 2019-03-13 NOTE — Telephone Encounter (Signed)
[employer] Auth: [id number] (exp. 03/13/19 to 09/08/19) order sent to GI. GI was in network [facility] was out of network for the MRI. GI will reach out to the patient to schedule.

## 2019-03-31 ENCOUNTER — Ambulatory Visit
Admission: RE | Admit: 2019-03-31 | Discharge: 2019-03-31 | Disposition: A | Discharge status: Home / Self Care | Payer: BC Managed Care – PPO | Source: Ambulatory Visit | Attending: Neurology | Admitting: Neurology

## 2019-03-31 ENCOUNTER — Other Ambulatory Visit: Payer: Self-pay

## 2019-03-31 DIAGNOSIS — R419 Unspecified symptoms and signs involving cognitive functions and awareness: Secondary | ICD-10-CM

## 2019-03-31 DIAGNOSIS — C50911 Malignant neoplasm of unspecified site of right female breast: Secondary | ICD-10-CM

## 2019-03-31 DIAGNOSIS — D61818 Other pancytopenia: Secondary | ICD-10-CM

## 2019-03-31 MED ORDER — GADOBENATE DIMEGLUMINE 529 MG/ML IV SOLN
17.0000 mL | Freq: Once | INTRAVENOUS | Status: AC | PRN
  Administered 2019-03-31: 15:00:00 17 mL via INTRAVENOUS

## 2019-04-01 ENCOUNTER — Telehealth: Payer: Self-pay | Admitting: Neurology

## 2019-04-01 ENCOUNTER — Encounter: Payer: Self-pay | Admitting: Neurology

## 2019-04-01 NOTE — Telephone Encounter (Signed)
-----   Message from Larey Seat, MD sent at 04/01/2019  1:09 PM EDT ----- IMPRESSION: This MRI of the brain with and without contrast shows the following: 1.   There are no significant findings in the brain.  2.   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 3.   Mild chronic sinusitis.

## 2019-04-01 NOTE — Telephone Encounter (Signed)
Called the pt to discuss MRI findings. No answer and unable to leave a message. I will send a mychart message to the patient.  ** If pt calls back please advise that MRI of the brain showed no significant findings.

## 2019-04-03 NOTE — Telephone Encounter (Signed)
Called the patient for the 2nd time to review sleep study. VM full. Will send a letter

## 2019-06-18 ENCOUNTER — Encounter: Payer: Self-pay | Admitting: Gastroenterology

## 2019-10-14 ENCOUNTER — Other Ambulatory Visit: Payer: Self-pay | Admitting: Family Medicine

## 2019-10-14 ENCOUNTER — Other Ambulatory Visit (HOSPITAL_COMMUNITY)
Admission: RE | Admit: 2019-10-14 | Discharge: 2019-10-14 | Disposition: A | Discharge status: Home / Self Care | Payer: BC Managed Care – PPO | Source: Ambulatory Visit | Attending: Family Medicine | Admitting: Family Medicine

## 2019-10-14 DIAGNOSIS — Z124 Encounter for screening for malignant neoplasm of cervix: Secondary | ICD-10-CM | POA: Insufficient documentation

## 2019-10-17 LAB — CYTOLOGY - PAP
Comment: NEGATIVE
Diagnosis: NEGATIVE
High risk HPV: NEGATIVE

## 2019-11-13 ENCOUNTER — Other Ambulatory Visit: Payer: Self-pay

## 2019-11-13 ENCOUNTER — Encounter (HOSPITAL_COMMUNITY): Payer: Self-pay | Admitting: Surgery

## 2019-11-14 ENCOUNTER — Ambulatory Visit (HOSPITAL_COMMUNITY): Payer: BC Managed Care – PPO | Admitting: Hematology

## 2019-11-14 DIAGNOSIS — R779 Abnormality of plasma protein, unspecified: Secondary | ICD-10-CM | POA: Insufficient documentation

## 2019-11-14 NOTE — Assessment & Plan Note (Deleted)
1.  Elevated total protein levels: -Patient seen at the request of Dr. Mannie Stabile for elevated total protein levels of 9.5 on 10/29/2019.  Albumin was 3.9. -Her total protein was 9.0 on 10/14/2019 and 8.4 on 03/14/2018. -Most recent CBC showed hemoglobin 12.8 with white count of 4.4 and platelet count 184.  Calcium corrected was 9.04.  2.  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  ER 1% positive and PR 0%, HER-2 negative. -She was started on dose dense AC chemotherapy from 04/16/2015 through 05/27/2015 but had significant side effects and refused to continue.  She was treated by Dr. Lesle Reek in [place]. -Last left breast mammogram on 02/19/2016 was BI-RADS Category 1.

## 2019-12-03 ENCOUNTER — Other Ambulatory Visit: Payer: Self-pay

## 2019-12-03 ENCOUNTER — Encounter (HOSPITAL_COMMUNITY): Payer: Self-pay | Admitting: Hematology

## 2019-12-03 ENCOUNTER — Inpatient Hospital Stay (HOSPITAL_COMMUNITY): Payer: BC Managed Care – PPO | Attending: Hematology | Admitting: Hematology

## 2019-12-03 ENCOUNTER — Inpatient Hospital Stay (HOSPITAL_COMMUNITY): Payer: BC Managed Care – PPO

## 2019-12-03 VITALS — BP 122/67 | HR 89 | Temp 96.9°F | Resp 18 | Ht 68.0 in | Wt 178.0 lb

## 2019-12-03 DIAGNOSIS — Z853 Personal history of malignant neoplasm of breast: Secondary | ICD-10-CM | POA: Insufficient documentation

## 2019-12-03 DIAGNOSIS — E8809 Other disorders of plasma-protein metabolism, not elsewhere classified: Secondary | ICD-10-CM | POA: Diagnosis not present

## 2019-12-03 DIAGNOSIS — R779 Abnormality of plasma protein, unspecified: Secondary | ICD-10-CM

## 2019-12-03 DIAGNOSIS — Z9011 Acquired absence of right breast and nipple: Secondary | ICD-10-CM | POA: Insufficient documentation

## 2019-12-03 DIAGNOSIS — Z8701 Personal history of pneumonia (recurrent): Secondary | ICD-10-CM | POA: Insufficient documentation

## 2019-12-03 DIAGNOSIS — D61818 Other pancytopenia: Secondary | ICD-10-CM | POA: Insufficient documentation

## 2019-12-03 DIAGNOSIS — Z800 Family history of malignant neoplasm of digestive organs: Secondary | ICD-10-CM | POA: Diagnosis not present

## 2019-12-03 DIAGNOSIS — R232 Flushing: Secondary | ICD-10-CM | POA: Diagnosis not present

## 2019-12-03 DIAGNOSIS — Z87891 Personal history of nicotine dependence: Secondary | ICD-10-CM | POA: Insufficient documentation

## 2019-12-03 DIAGNOSIS — Z9221 Personal history of antineoplastic chemotherapy: Secondary | ICD-10-CM

## 2019-12-03 LAB — COMPREHENSIVE METABOLIC PANEL
ALT: 17 U/L (ref 0–44)
AST: 21 U/L (ref 15–41)
Albumin: 3.7 g/dL (ref 3.5–5.0)
Alkaline Phosphatase: 74 U/L (ref 38–126)
Anion gap: 5 (ref 5–15)
BUN: 15 mg/dL (ref 6–20)
CO2: 25 mmol/L (ref 22–32)
Calcium: 8.9 mg/dL (ref 8.9–10.3)
Chloride: 105 mmol/L (ref 98–111)
Creatinine, Ser: 0.85 mg/dL (ref 0.44–1.00)
GFR calc Af Amer: >60 mL/min (ref >60)
GFR calc non Af Amer: >60 mL/min (ref >60)
Glucose, Bld: 99 mg/dL (ref 70–99)
Potassium: 4.2 mmol/L (ref 3.5–5.1)
Sodium: 135 mmol/L (ref 135–145)
Total Bilirubin: 0.5 mg/dL (ref 0.3–1.2)
Total Protein: 10.4 g/dL — ABNORMAL HIGH (ref 6.5–8.1)

## 2019-12-03 LAB — CBC WITH DIFFERENTIAL/PLATELET
Abs Immature Granulocytes: 0.01 10*3/uL (ref 0.00–0.07)
Basophils Absolute: 0 10*3/uL (ref 0.0–0.1)
Basophils Relative: 1 %
Eosinophils Absolute: 0.2 10*3/uL (ref 0.0–0.5)
Eosinophils Relative: 6 %
HCT: 41.7 % (ref 36.0–46.0)
Hemoglobin: 13.7 g/dL (ref 12.0–15.0)
Immature Granulocytes: 0 %
Lymphocytes Relative: 23 %
Lymphs Abs: 0.9 10*3/uL (ref 0.7–4.0)
MCH: 30.3 pg (ref 26.0–34.0)
MCHC: 32.9 g/dL (ref 30.0–36.0)
MCV: 92.3 fL (ref 80.0–100.0)
Monocytes Absolute: 0.4 10*3/uL (ref 0.1–1.0)
Monocytes Relative: 9 %
Neutro Abs: 2.3 10*3/uL (ref 1.7–7.7)
Neutrophils Relative %: 61 %
Platelets: 200 10*3/uL (ref 150–400)
RBC: 4.52 MIL/uL (ref 3.87–5.11)
RDW: 13.5 % (ref 11.5–15.5)
WBC: 3.8 10*3/uL — ABNORMAL LOW (ref 4.0–10.5)
nRBC: 0 % (ref 0.0–0.2)

## 2019-12-03 LAB — HEPATITIS B SURFACE ANTIGEN: Hepatitis B Surface Ag: NONREACTIVE

## 2019-12-03 LAB — LACTATE DEHYDROGENASE: LDH: 111 U/L (ref 98–192)

## 2019-12-03 LAB — HEPATITIS B SURFACE ANTIBODY,QUALITATIVE: Hep B S Ab: NONREACTIVE

## 2019-12-03 LAB — HEPATITIS B CORE ANTIBODY, TOTAL: Hep B Core Total Ab: NONREACTIVE

## 2019-12-03 LAB — HEPATITIS C ANTIBODY: HCV Ab: NONREACTIVE

## 2019-12-03 NOTE — Patient Instructions (Signed)
[facility] at [facility] Discharge Instructions  You were seen today by Dr. Katragadda. He went over your recent results. He will see you back in for labs and follow up.   Thank you for choosing [facility] at [facility] to provide your oncology and hematology care.  To afford each patient quality time with our provider, please arrive at least 15 minutes before your scheduled appointment time.   If you have a lab appointment with the Cancer Center please come in thru the  Main Entrance and check in at the main information desk  You need to re-schedule your appointment should you arrive 10 or more minutes late.  We strive to give you quality time with our providers, and arriving late affects you and other patients whose appointments are after yours.  Also, if you no show three or more times for appointments you may be dismissed from the clinic at the providers discretion.     Again, thank you for choosing [facility].  Our hope is that these requests will decrease the amount of time that you wait before being seen by our physicians.       _____________________________________________________________  Should you have questions after your visit to [facility], please contact our office at [phone] between the hours of 8:00 a.m. and 4:30 p.m.  Voicemails left after 4:00 p.m. will not be returned until the following business day.  For prescription refill requests, have your pharmacy contact our office and allow 72 hours.    Cancer Center Support Programs:   > Cancer Support Group  2nd Tuesday of the month 1pm-2pm, Journey Room    

## 2019-12-03 NOTE — Progress Notes (Signed)
[facility] [address], [place] [address]   CLINIC:  Medical Oncology/Hematology  CONSULT NOTE  Patient Care Team: [name], MD as PCP - General (Family Medicine) Herminio Commons, MD as PCP - Cardiology (Cardiology) Danie Binder, MD (Inactive) as Consulting Physician (Gastroenterology) Derek Jack, MD as Consulting Physician (Hematology)  CHIEF COMPLAINTS/PURPOSE OF CONSULTATION:  Abnormal protein    HISTORY OF PRESENTING ILLNESS:  Amy Douglas 49 y.o. female is seen in consultation today at the request of Dr. Mannie Stabile for further work-up and management of elevated total protein.  Labs on 10/29/2019 shows total protein of 9.5.  And SPEP did not show M spike on the same day.  Hemoglobin was 12.8 with normal white count and platelet count.  Denies any new onset bone pains.  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 She was treated at the cancer center in [place] through [facility]. She underwent chemotherapy. She did not undergo antiestrogen therapy. She continues to get her mammograms yearly and give herself routine self breast exams.   She denies a history of hepatitis. 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 She denies transfusions in the last 20 years. She denies any new bone pains. She has been having horrible night sweats and notes that she has lost about 50 lbs in the last 1 year without trying to lose weight. However, she does note a tremendous amount of stress in her life in the last year.  She apparently gained back about 8 pounds.  She notes that she has hot flashes during the day, but she has not gone through menopause yet.   She was found to have abnormal protein on 10/14/2019. She denies recent chest pain on exertion, shortness of breath on minimal exertion, pre-syncopal episodes, or palpitations. She  had not noticed any recent bleeding such as epistaxis, hematuria or hematochezia. Her last colonoscopy was 01/24/2019 and removed an adenoma.  She quit smoking cigarettes in 2002.  She smoked 1 pack/day for 10 years.  She now works at [employer].  No history of exposure to chemicals.  Family history significant for few maternal uncles and aunts with cancers.  One of them had stomach cancer.  MEDICAL HISTORY:  Past Medical History:  Diagnosis Date  . Breast cancer ([place])    Rt breast, 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  . Pneumonia     SURGICAL HISTORY: Past Surgical History:  Procedure Laterality Date  . anterior cervical discectomy and fusion    . COLONOSCOPY N/A 01/24/2019   Procedure: COLONOSCOPY;  Surgeon: Danie Binder, MD;  Location: [facility];  Service: Endoscopy;  Laterality: N/A;  2:00pm  . MASTECTOMY    . maxillofacial Bilateral   . POLYPECTOMY  01/24/2019   Procedure: POLYPECTOMY;  Surgeon: Danie Binder, MD;  Location: [facility];  Service: Endoscopy;;    SOCIAL HISTORY: Social History   Socioeconomic History  . Marital status: Married    Spouse name: Not on file  . Number of children: 1  . Years of education: Not on file  . Highest education level: Not on file  Occupational History  . Occupation: Restaurant manager, fast food: [id number]  Tobacco Use  . Smoking status: Former Smoker    Packs/day: 1.00    Years: 12.00    Pack years: 12.00  Types: Cigarettes    Quit date: 06/30/2001    Years since quitting: 18.4  . Smokeless tobacco: Never Used  Substance and Sexual Activity  . Alcohol use: Yes    Comment: occasional  . Drug use: No  . Sexual activity: Yes  Other Topics Concern  . 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 MARRIED: 1 KID(FEMALE 1994).   Social Determinants of Health   Financial Resource Strain:   . Difficulty of Paying Living Expenses:   Food Insecurity:   . Worried About Charity fundraiser in the Last Year:    . Arboriculturist in the Last Year:   Transportation Needs:   . Film/video editor (Medical):   Marland Kitchen Lack of Transportation (Non-Medical):   Physical Activity:   . Days of Exercise per Week:   . Minutes of Exercise per Session:   Stress:   . Feeling of Stress :   Social Connections:   . Frequency of Communication with Friends and Family:   . Frequency of Social Gatherings with Friends and Family:   . Attends Religious Services:   . Active Member of Clubs or Organizations:   . Attends Archivist Meetings:   Marland Kitchen Marital Status:   Intimate Partner Violence:   . Fear of Current or Ex-Partner:   . Emotionally Abused:   Marland Kitchen Physically Abused:   . Sexually Abused:     FAMILY HISTORY: Family History  Problem Relation Age of Onset  . Hypertension Mother   . Dementia Mother   . Diabetes Father   . Dementia Father   . Heart disease Father   . Diabetes Maternal Grandmother   . Diabetes Paternal Grandmother   . Heart disease Paternal Grandmother   . Stroke Paternal Grandmother   . Hiatal hernia Son   . Cancer Maternal Aunt   . Colon cancer Neg Hx   . Colon polyps Neg Hx     ALLERGIES:  is allergic to hydroxyzine pamoate; amoxicillin-pot clavulanate; latex; other; diclofenac; levaquin [levofloxacin]; prednisone; vistaril [hydroxyzine hcl]; and lamotrigine.  MEDICATIONS:  Current Outpatient Medications  Medication Sig Dispense Refill  . sertraline (ZOLOFT) 100 MG tablet Take 1 tablet (100 mg total) by mouth daily. 90 tablet 1  . Aspirin-Acetaminophen-Caffeine (GOODYS EXTRA STRENGTH PO) Take 1 packet by mouth as needed (pain.).      No current facility-administered medications for this visit.    REVIEW OF SYSTEMS:   Review of Systems  All other systems reviewed and are negative.    PHYSICAL EXAMINATION: ECOG PERFORMANCE STATUS: 0 - Asymptomatic  Vitals:   12/03/19 1323  BP: 122/67  Pulse: 89  Resp: 18  Temp: (!) 96.9 F (36.1 C)  SpO2: 99%   Filed  Weights   12/03/19 1323  Weight: 178 lb (80.7 kg)   Physical Exam Vitals reviewed.  Constitutional:      Appearance: Normal appearance.  Cardiovascular:     Rate and Rhythm: Normal rate and regular rhythm.     Heart sounds: Normal heart sounds.  Pulmonary:     Effort: Pulmonary effort is normal.     Breath sounds: Normal breath sounds.  Abdominal:     General: There is no distension.     Palpations: Abdomen is soft. There is no mass.  Musculoskeletal:        General: No swelling.  Lymphadenopathy:     Cervical: No cervical adenopathy.  Skin:    General: Skin  is warm.  Neurological:     General: No focal deficit present.     Mental Status: She is alert and oriented to person, place, and time.  Psychiatric:        Mood and Affect: Mood normal.        Behavior: Behavior normal.      LABORATORY DATA:  I have reviewed the data as listed Recent Results (from the past 2160 hour(s))  Cytology - PAP     Status: None   Collection Time: 10/14/19 12:00 AM  Result Value Ref Range   High risk HPV Negative    Adequacy      Satisfactory for evaluation; transformation zone component PRESENT.   Diagnosis      - Negative for intraepithelial lesion or malignancy (NILM)   Comment Normal Reference Range HPV - Negative    10/14/2019     RADIOGRAPHIC STUDIES: I have personally reviewed the radiological images as listed and agreed with the findings in the report.  ASSESSMENT:  1.  Elevated total protein levels: -Patient seen at the request of Dr. Mannie Stabile for elevated total protein of 9.5 (6-8.3 g/dL) on 10/29/2019.  Albumin was 3.9.  Calcium was normal.  SPEP did not show M spike. -She does not report any new onset bone pains. -Reported 50 pound weight loss in the last 1 year but she was under stress as her husband was hospitalized and she was working 3rd shift. -She reported that she gained 8 pounds back in the last few months.  She also has hot flashes.  2.  Stage IIa (PT2PN0)  triple negative right breast cancer: -Right breast mastectomy and sentinel lymph node biopsy on 03/10/2015, 4.3 cm high-grade tumor with negative margins, negative sentinel lymph node, grade 3.  ER 1%, PR 0% and HER-2 negative. -She was treated with 4 cycles of dose dense AC from 04/16/2015 through 05/27/2015 but had significant side effects and did not receive paclitaxel.  She was treated in the ED none. -Last left breast mammogram on 02/19/2016 was BI-RADS Category 1 in our epic system.    PLAN:  1.  Elevated total protein levels: -Differential diagnosis includes polyclonal gammopathy versus monoclonal gammopathy. -We will repeat her CMP today.  We will check for serum immunofixation, free light chains and SPEP.  We will also check for LDH.  We will check for connective tissue disorders including ANA, rheumatoid factor.  We will also check for hepatitis panel. -RTC 2 weeks to discuss results.  2.  Stage II right breast cancer: -We will obtain mammogram reports of the left breast.    All questions were answered. The patient knows to call the clinic with any problems, questions or concerns.  Derek Jack, MD 12/03/19 1:53 PM  [place] [phone]   I, Jacqualyn Posey, am acting as a scribe for Dr. Sanda Linger.  I, Derek Jack MD, have reviewed the above documentation for accuracy and completeness, and I agree with the above.

## 2019-12-04 LAB — PROTEIN ELECTROPHORESIS, SERUM
A/G Ratio: 0.6 — ABNORMAL LOW (ref 0.7–1.7)
Albumin ELP: 3.5 g/dL (ref 2.9–4.4)
Alpha-1-Globulin: 0.1 g/dL (ref 0.0–0.4)
Alpha-2-Globulin: 0.8 g/dL (ref 0.4–1.0)
Beta Globulin: 1 g/dL (ref 0.7–1.3)
Gamma Globulin: 4.2 g/dL — ABNORMAL HIGH (ref 0.4–1.8)
Globulin, Total: 6.1 g/dL — ABNORMAL HIGH (ref 2.2–3.9)
Total Protein ELP: 9.6 g/dL — ABNORMAL HIGH (ref 6.0–8.5)

## 2019-12-04 LAB — KAPPA/LAMBDA LIGHT CHAINS
Kappa free light chain: 101.3 mg/L — ABNORMAL HIGH (ref 3.3–19.4)
Kappa, lambda light chain ratio: 1.14 (ref 0.26–1.65)
Lambda free light chains: 89.1 mg/L — ABNORMAL HIGH (ref 5.7–26.3)

## 2019-12-08 LAB — IMMUNOFIXATION ELECTROPHORESIS
IgA: 208 mg/dL (ref 87–352)
IgG (Immunoglobin G), Serum: 4401 mg/dL — ABNORMAL HIGH (ref 586–1602)
IgM (Immunoglobulin M), Srm: 109 mg/dL (ref 26–217)
Total Protein ELP: 9.5 g/dL — ABNORMAL HIGH (ref 6.0–8.5)

## 2019-12-25 ENCOUNTER — Other Ambulatory Visit: Payer: Self-pay

## 2019-12-25 ENCOUNTER — Inpatient Hospital Stay (HOSPITAL_COMMUNITY): Payer: BC Managed Care – PPO | Admitting: Oncology

## 2019-12-25 VITALS — BP 117/80 | HR 89 | Temp 98.7°F | Resp 18 | Wt 181.4 lb

## 2019-12-25 DIAGNOSIS — D61818 Other pancytopenia: Secondary | ICD-10-CM

## 2019-12-25 DIAGNOSIS — R779 Abnormality of plasma protein, unspecified: Secondary | ICD-10-CM | POA: Diagnosis not present

## 2019-12-25 DIAGNOSIS — E8809 Other disorders of plasma-protein metabolism, not elsewhere classified: Secondary | ICD-10-CM | POA: Diagnosis not present

## 2019-12-25 DIAGNOSIS — C50911 Malignant neoplasm of unspecified site of right female breast: Secondary | ICD-10-CM

## 2019-12-25 NOTE — Progress Notes (Signed)
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  No anemia, hypercalcemia, or renal dysfunction. -Patient seen at the request of Dr. Mannie Douglas for elevated total protein of 9.5 (6-8.3 g/dL) on 10/29/2019.  Albumin was 3.9.  Calcium was normal.  SPEP did not show M spike. IFE demonstrate polyclonal gammopathy -She does not report any new onset bone pains. -Reported 50 pound weight loss in the last 1 year but she was under stress as her husband was hospitalized and she was working 3rd shift. AND Stage IIa (PT2PN0) triple negative right breast cancer: -Right breast mastectomy and sentinel lymph node biopsy on 03/10/2015, 4.3 cm high-grade tumor with negative margins, negative sentinel lymph node, grade 3.  ER 1%, PR 0% and HER-2 negative. -She was treated with 4 cycles of dose dense AC from 04/16/2015 through 05/27/2015 but had significant side effects and did not receive paclitaxel.  She was treated in the ED none.  CURRENT STATUS: Amy Douglas 49 y.o. female returns for followup of in follow-up of newly diagnosed polyclonal gammopathy without anemia, hypercalcemia, or renal dysfunction and history of triple negative right-sided breast cancer in 2016 treated by Dr. Dina Douglas in [place], [place] with curative intent.  She is doing well overall.  She had her mammogram completed recently that was negative.  This was completed at [facility].  She denies any new lumps or bumps on her examination.  No new pain.  Appetite is good and weight is stable.  No abdominal bloating or discomfort.  She denies any new cough or hemoptysis.  No new  neurological deficits including headaches, dizziness, double vision, LOC, and seizure.  She denies any new B symptoms including fevers, chills, night sweats, early satiety, and unintentional weight loss.  No recent infections or antibiotic therapy.  She works for [employer] in [place] and currently working in [place], [place]: Negative.  Negative for chills, fever and weight loss.  HENT: Negative.   Eyes: Negative.   Respiratory: Negative.  Negative for cough.   Cardiovascular: Negative.  Negative for chest pain.  Gastrointestinal: Negative.  Negative for blood in stool, constipation, diarrhea, melena, nausea and vomiting.  Genitourinary: Negative.   Musculoskeletal: Negative.   Skin: Negative.   Neurological: Negative.  Negative for weakness.  Endo/Heme/Allergies: Negative.   Psychiatric/Behavioral: Negative.     Past Medical History:  Diagnosis Date  . Breast cancer ([place])    Rt breast, 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  . Pneumonia      PHYSICAL EXAMINATION  ECOG PERFORMANCE STATUS: 0 - Asymptomatic  Vitals:   12/25/19 1517  BP: 117/80  Pulse: 89  Resp: 18  Temp: 98.7 F (37.1 C)  SpO2: 98%    GENERAL:alert, no distress, well nourished, well developed, comfortable, cooperative, smiling and unaccompanied SKIN: skin color, texture, turgor are normal, no rashes or significant lesions HEAD: Normocephalic, No masses, lesions, tenderness or abnormalities EYES: normal EARS: External ears normal OROPHARYNX: Not examined, mask in place NECK: supple LYMPH:  no palpable lymphadenopathy BREAST:not examined, mammogram completed within the last 48 hours. LUNGS: clear to auscultation and percussion HEART: regular rate & rhythm, no murmurs and no gallops ABDOMEN:abdomen  soft and normal bowel sounds BACK: Back symmetric, no curvature. EXTREMITIES:less then 2 second capillary refill, no skin discoloration  NEURO: alert & oriented x 3 with fluent  speech, no focal motor/sensory deficits   LABORATORY DATA: CBC    Component Value Date/Time   WBC 3.8 (L) 12/03/2019 1434   RBC 4.52 12/03/2019 1434   HGB 13.7 12/03/2019 1434   HCT 41.7 12/03/2019 1434   PLT 200 12/03/2019 1434   MCV 92.3 12/03/2019 1434   MCH 30.3 12/03/2019 1434   MCHC 32.9 12/03/2019 1434   RDW 13.5 12/03/2019 1434   LYMPHSABS 0.9 12/03/2019 1434   MONOABS 0.4 12/03/2019 1434   EOSABS 0.2 12/03/2019 1434   BASOSABS 0.0 12/03/2019 1434      Chemistry      Component Value Date/Time   NA 135 12/03/2019 1434   K 4.2 12/03/2019 1434   CL 105 12/03/2019 1434   CO2 25 12/03/2019 1434   BUN 15 12/03/2019 1434   CREATININE 0.85 12/03/2019 1434   CREATININE 0.76 03/21/2018 1125      Component Value Date/Time   CALCIUM 8.9 12/03/2019 1434   ALKPHOS 74 12/03/2019 1434   AST 21 12/03/2019 1434   ALT 17 12/03/2019 1434   BILITOT 0.5 12/03/2019 1434       RADIOGRAPHIC STUDIES:  No results found.     ASSESSMENT AND PLAN:  1. Elevated serum protein level -Patient seen at the request of Dr. Mannie Douglas for elevated total protein of 9.5 (6-8.3 g/dL) on 10/29/2019.  Albumin was 3.9.  Calcium was normal.  SPEP did not show M spike. IFE demonstrate polyclonal gammopathy -She does not report any new onset bone pains. -Reported 50 pound weight loss in the last 1 year but she was under stress as her husband was hospitalized and she was working 3rd shift.   Laboratory work performed on 12/03/2019 reveals a leukopenia of 3.8 with normal differential, hemoglobin normal at 13.7 g/dL and normal platelet count of 200,000.  Metabolic panel reveals a hyperproteinemia at 10.4 with normal calcium, otherwise unremarkable.  Multiple myeloma panel was negative for M spike with elevation of kappa and lambda light chains with normal ratio 1.14.  LDH normal at 111.  Immunofixation demonstrates a polyclonal increase with an elevated IgG of 4401 and normal IgA and IgM.  Hepatitis B and  hepatitis C testing are negative.  Given that her work-up has demonstrated a polyclonal gammopathy, this is a benign finding.  No anemia, renal dysfunction, or hypercalcemia.  Laboratory work in 4 months: CBC diff, CMET, SPEP + IFE, light chain assay.   2. Carcinoma of right female breast, unspecified estrogen receptor status, unspecified site of breast ([place]) Stage IIa (PT2PN0) triple negative right breast cancer: -Right breast mastectomy and sentinel lymph node biopsy on 03/10/2015, 4.3 cm high-grade tumor with negative margins, negative sentinel lymph node, grade 3.  ER 1%, PR 0% and HER-2 negative. -She was treated with 4 cycles of dose dense AC from 04/16/2015 through 05/27/2015 but had significant side effects and did not receive paclitaxel.  She was treated in the ED none.  Mammogram on 12/25/2019 performed at [facility] with BI-RADS Category 1.  3. Pancytopenia ([place]) Resolved.  Leukopenia is noted with normal differential.   ORDERS PLACED FOR THIS ENCOUNTER: Orders Placed This Encounter  Procedures  . CBC with Differential  . Comprehensive metabolic panel  . Protein electrophoresis, serum  . Kappa/lambda light chains    MEDICATIONS PRESCRIBED THIS ENCOUNTER: No orders of the  defined types were placed in this encounter.   All questions were answered. The patient knows to call the clinic with any problems, questions or concerns. We can certainly see the patient much sooner if necessary.  Patient and plan discussed with Dr. Derek Jack and he is in agreement with the aforementioned.   This note is electronically signed by: Robynn Pane, PA-C 12/25/2019 4:27 PM

## 2019-12-25 NOTE — Patient Instructions (Signed)
[facility] [place] at [facility] Discharge Instructions  You were seen today by Kirby Crigler PA. He went over your recent lab results. He will see you back in 4 months for labs and follow up.   Thank you for choosing [place] at [facility] to provide your oncology and hematology care.  To afford each patient quality time with our provider, please arrive at least 15 minutes before your scheduled appointment time.   If you have a lab appointment with the [facility] please come in thru the  Main Entrance and check in at the main information desk  You need to re-schedule your appointment should you arrive 10 or more minutes late.  We strive to give you quality time with our providers, and arriving late affects you and other patients whose appointments are after yours.  Also, if you no show three or more times for appointments you may be dismissed from the clinic at the providers discretion.     Again, thank you for choosing [facility]  Our hope is that these requests will decrease the amount of time that you wait before being seen by our physicians.       _____________________________________________________________  Should you have questions after your visit to [facility], please contact our office at [phone] between the hours of 8:00 a.m. and 4:30 p.m.  Voicemails left after 4:00 p.m. will not be returned until the following business day.  For prescription refill requests, have your pharmacy contact our office and allow 72 hours.    Cancer Center Support Programs:   > Cancer Support Group  2nd Tuesday of the month 1pm-2pm, Journey Room

## 2020-04-14 ENCOUNTER — Inpatient Hospital Stay (HOSPITAL_COMMUNITY): Payer: BC Managed Care – PPO | Attending: Hematology

## 2020-04-17 ENCOUNTER — Other Ambulatory Visit (HOSPITAL_COMMUNITY): Payer: BC Managed Care – PPO

## 2020-04-21 ENCOUNTER — Ambulatory Visit (HOSPITAL_COMMUNITY): Payer: BC Managed Care – PPO | Admitting: Oncology

## 2020-04-24 ENCOUNTER — Ambulatory Visit (HOSPITAL_COMMUNITY): Payer: BC Managed Care – PPO | Admitting: Nurse Practitioner

## 2021-04-23 DIAGNOSIS — Z23 Encounter for immunization: Secondary | ICD-10-CM | POA: Diagnosis not present

## 2021-05-07 DIAGNOSIS — S76011A Strain of muscle, fascia and tendon of right hip, initial encounter: Secondary | ICD-10-CM | POA: Diagnosis not present

## 2021-05-13 ENCOUNTER — Ambulatory Visit: Payer: BC Managed Care – PPO | Admitting: Orthopaedic Surgery

## 2021-05-13 ENCOUNTER — Encounter: Payer: Self-pay | Admitting: Orthopaedic Surgery

## 2021-05-13 ENCOUNTER — Other Ambulatory Visit: Payer: Self-pay

## 2021-05-13 ENCOUNTER — Ambulatory Visit: Payer: Self-pay

## 2021-05-13 VITALS — Ht 68.0 in | Wt 190.0 lb

## 2021-05-13 DIAGNOSIS — M25551 Pain in right hip: Secondary | ICD-10-CM

## 2021-05-13 MED ORDER — PREDNISONE 5 MG PO TABS
5.0000 mg | ORAL_TABLET | Freq: Every day | ORAL | 0 refills | Status: DC
Start: 2021-05-13 — End: 2024-02-22

## 2021-05-13 NOTE — Progress Notes (Signed)
Office Visit Note   Patient: Amy Douglas           Date of Birth: [birth year]           MRN: [id number] Visit Date: 05/13/2021              Requested by: [name], MD [address],  [place] [address] PCP: [name], MD   Assessment & Plan: Visit Diagnoses:  1. Pain in right hip     Plan: We will place patient on low-dose prednisone for short course for likely disc protrusion with some radicular symptoms.  Recheck 2 weeks.  Follow-Up Instructions: No follow-ups on file.   Orders:  Orders Placed This Encounter  Procedures   XR Lumbar Spine 2-3 Views   XR HIP UNILAT W OR W/O PELVIS 2-3 VIEWS RIGHT   No orders of the defined types were placed in this encounter.     Procedures: No procedures performed   Clinical Data: No additional findings.   Subjective: Chief Complaint  Patient presents with   Right Hip - Pain    HPI 50 year old female with right hip pain intermittent sporadic times 2+ years.  Patient's had some catching in the hip.  Treated with muscle relaxant.  Some improvement.  Pain usually 1 or 2 times a month on 24 hours.  Persistent symptoms now longer more significant.  History of breast cancer 7 years ago.  Back pain is been severe x11 days.  Patient quit smoking 2002.  Previous cervical fusion doing well.  Mastectomy maxillofacial surgery.  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  Positive for anxiety depression migraine sleep apnea.  Review of Systems all systems noncontributory to HPI.   Objective: Vital Signs: Ht 5\' 8"  (1.727 m)   Wt 190 lb (86.2 kg)   BMI 28.89 kg/m   Physical Exam Constitutional:      Appearance: She is well-developed.  HENT:     Head: Normocephalic.     Right Ear: External ear normal.     Left Ear: External ear normal. There is no impacted cerumen.  Eyes:     Pupils: Pupils are equal, round, and reactive to light.  Neck:     Thyroid: No thyromegaly.     Trachea: No tracheal deviation.   Cardiovascular:     Rate and Rhythm: Normal rate.  Pulmonary:     Effort: Pulmonary effort is normal.  Abdominal:     Palpations: Abdomen is soft.  Musculoskeletal:     Cervical back: No rigidity.  Skin:    General: Skin is warm and dry.  Neurological:     Mental Status: She is alert and oriented to person, place, and time.  Psychiatric:        Behavior: Behavior normal.    Ortho Exam positive straight leg raising 45 degrees.  Anterior tib EHL is intact.  Specialty Comments:  No specialty comments available.  Imaging: No results found.   PMFS History: Patient Active Problem List   Diagnosis Date Noted   Elevated serum protein level 11/14/2019   Rectal bleeding 11/29/2018   Hypophosphatemia 03/05/2018   Sepsis due to undetermined organism ([place]) 03/04/2018   Pancytopenia ([place]) 03/04/2018   HTN, goal below 140/90 08/07/2017   Low HDL (under 40) 08/07/2017   Migraine without status migrainosus, not intractable 03/28/2017   History of migraine headaches 03/28/2017   Seasonal allergic rhinitis due to pollen 03/28/2017   Need for immunization against influenza 03/28/2017  Anxiety 03/28/2017   Port-A-Cath in place 04/09/2015   Carcinoma of right breast ([place]) 03/31/2015   Chronic pain syndrome 03/31/2015   Status post right mastectomy 03/31/2015   Past Medical History:  Diagnosis Date   Breast cancer ([place])    Rt breast, 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   Pneumonia     Family History  Problem Relation Age of Onset   Hypertension Mother    Dementia Mother    Diabetes Father    Dementia Father    Heart disease Father    Diabetes Maternal Grandmother    Diabetes Paternal Grandmother    Heart disease Paternal Grandmother    Stroke Paternal Grandmother    Hiatal hernia Son    Cancer Maternal Aunt    Colon cancer Neg Hx    Colon polyps Neg Hx     Past Surgical History:  Procedure Laterality Date   anterior cervical discectomy and fusion     COLONOSCOPY N/A 01/24/2019    Procedure: COLONOSCOPY;  Surgeon: Danie Binder, MD;  [facility];  Service: Endoscopy;  Laterality: N/A;  2:00pm   MASTECTOMY     maxillofacial Bilateral    POLYPECTOMY  01/24/2019   Procedure: POLYPECTOMY;  Surgeon: Danie Binder, MD;  [facility];  Service: Endoscopy;;   Social History   Occupational History   Occupation: Restaurant manager, fast food: [id number]  Tobacco Use   Smoking status: Former    Packs/day: 1.00    Years: 12.00    Pack years: 12.00    Types: Cigarettes    Quit date: 06/30/2001    Years since quitting: 19.8   Smokeless tobacco: Never  Vaping Use   Vaping Use: Never used  Substance and Sexual Activity   Alcohol use: Yes    Comment: occasional   Drug use: No   Sexual activity: Yes

## 2021-06-03 ENCOUNTER — Encounter: Payer: Self-pay | Admitting: Orthopaedic Surgery

## 2021-06-03 ENCOUNTER — Ambulatory Visit (INDEPENDENT_AMBULATORY_CARE_PROVIDER_SITE_OTHER): Payer: BC Managed Care – PPO | Admitting: Orthopaedic Surgery

## 2021-06-03 ENCOUNTER — Other Ambulatory Visit: Payer: Self-pay

## 2021-06-03 VITALS — Ht 68.0 in | Wt 190.0 lb

## 2021-06-03 DIAGNOSIS — G8929 Other chronic pain: Secondary | ICD-10-CM

## 2021-06-03 DIAGNOSIS — M545 Low back pain, unspecified: Secondary | ICD-10-CM

## 2021-06-03 NOTE — Progress Notes (Signed)
Office Visit Note   Patient: Amy Douglas           Date of Birth: Apr 29, 1971           MRN: [id number] Visit Date: 06/03/2021              Requested by: [name], MD [address],  [place] [address] PCP: [name], MD   Assessment & Plan: Visit Diagnoses: No diagnosis found.  Plan: Patient has persistent problems with low back pain this time is lasted much longer without relief.  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  She like to proceed with the MRI scan due to chronic back pain with radicular symptoms without relief.  Office follow-up after MRI scan for review.  She has been treated with anti-inflammatories rest activity modification exercise program muscle relaxants without relief.  Follow-Up Instructions: No follow-ups on file.   Orders:  No orders of the defined types were placed in this encounter.  No orders of the defined types were placed in this encounter.     Procedures: No procedures performed   Clinical Data: No additional findings.   Subjective: Chief Complaint  Patient presents with   Lower Back - Pain, Follow-up    HPI follow-up for low back pain with radicular right leg pain intermittent episodic.  She states she is overall doing better and states she wants to resume work and is questing a work note.  She is going to resume work activity on 06/04/2021.  She denies bowel bladder symptoms.  Review of Systems 14 point system update unchanged.   Objective: Vital Signs: Ht 5\' 8"  (1.727 m)   Wt 190 lb (86.2 kg)   BMI 28.89 kg/m   Physical Exam Constitutional:      Appearance: She is well-developed.  HENT:     Head: Normocephalic.     Right Ear: External ear normal.     Left Ear: External ear normal. There is no impacted cerumen.  Eyes:     Pupils: Pupils are equal, round, and reactive to light.  Neck:     Thyroid: No thyromegaly.     Trachea: No  tracheal deviation.  Cardiovascular:     Rate and Rhythm: Normal rate.  Pulmonary:     Effort: Pulmonary effort is normal.  Abdominal:     Palpations: Abdomen is soft.  Musculoskeletal:     Cervical back: No rigidity.  Skin:    General: Skin is warm and dry.  Neurological:     Mental Status: She is alert and oriented to person, place, and time.  Psychiatric:        Behavior: Behavior normal.    Ortho Exam patient is able heel and toe walk.  Negative straight leg raising 90 degrees anterior tib EHL is intact.  Specialty Comments:  No specialty comments available.  Imaging: No results found.   PMFS History: Patient Active Problem List   Diagnosis Date Noted   Elevated serum protein level 11/14/2019   Rectal bleeding 11/29/2018   Hypophosphatemia 03/05/2018   Sepsis due to undetermined organism ([facility]) 03/04/2018   Pancytopenia ([place]) 03/04/2018   HTN, goal below 140/90 08/07/2017   Low HDL (under 40) 08/07/2017   Migraine without status migrainosus, not intractable 03/28/2017   History of migraine headaches 03/28/2017   Seasonal allergic rhinitis due to pollen 03/28/2017   Need for immunization against influenza 03/28/2017  Anxiety 03/28/2017   Port-A-Cath in place 04/09/2015   Carcinoma of right breast ([place]) 03/31/2015   Chronic pain syndrome 03/31/2015   Status post right mastectomy 03/31/2015   Past Medical History:  Diagnosis Date   Breast cancer ([facility])    Rt breast, 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   Pneumonia     Family History  Problem Relation Age of Onset   Hypertension Mother    Dementia Mother    Diabetes Father    Dementia Father    Heart disease Father    Diabetes Maternal Grandmother    Diabetes Paternal Grandmother    Heart disease Paternal Grandmother    Stroke Paternal Grandmother    Hiatal hernia Son    Cancer Maternal Aunt    Colon cancer Neg Hx    Colon polyps Neg Hx     Past Surgical History:  Procedure Laterality Date   anterior cervical  discectomy and fusion     COLONOSCOPY N/A 01/24/2019   Procedure: COLONOSCOPY;  Surgeon: Danie Binder, MD;  [facility];  Service: Endoscopy;  Laterality: N/A;  2:00pm   MASTECTOMY     maxillofacial Bilateral    POLYPECTOMY  01/24/2019   Procedure: POLYPECTOMY;  Surgeon: Danie Binder, MD;  [facility];  Service: Endoscopy;;   Social History   Occupational History   Occupation: Restaurant manager, fast food: [id number]  Tobacco Use   Smoking status: Former    Packs/day: 1.00    Years: 12.00    Pack years: 12.00    Types: Cigarettes    Quit date: 06/30/2001    Years since quitting: 19.9   Smokeless tobacco: Never  Vaping Use   Vaping Use: Never used  Substance and Sexual Activity   Alcohol use: Yes    Comment: occasional   Drug use: No   Sexual activity: Yes

## 2021-08-19 DIAGNOSIS — R197 Diarrhea, unspecified: Secondary | ICD-10-CM | POA: Diagnosis not present

## 2021-08-19 DIAGNOSIS — A084 Viral intestinal infection, unspecified: Secondary | ICD-10-CM | POA: Diagnosis not present

## 2021-08-30 DIAGNOSIS — J209 Acute bronchitis, unspecified: Secondary | ICD-10-CM | POA: Diagnosis not present

## 2021-08-30 DIAGNOSIS — J019 Acute sinusitis, unspecified: Secondary | ICD-10-CM | POA: Diagnosis not present

## 2021-08-30 DIAGNOSIS — A084 Viral intestinal infection, unspecified: Secondary | ICD-10-CM | POA: Diagnosis not present

## 2021-10-24 ENCOUNTER — Other Ambulatory Visit: Payer: Self-pay | Admitting: Orthopaedic Surgery

## 2021-11-18 DIAGNOSIS — H1033 Unspecified acute conjunctivitis, bilateral: Secondary | ICD-10-CM | POA: Diagnosis not present

## 2021-11-23 DIAGNOSIS — J019 Acute sinusitis, unspecified: Secondary | ICD-10-CM | POA: Diagnosis not present

## 2021-12-01 ENCOUNTER — Ambulatory Visit
Admission: RE | Admit: 2021-12-01 | Discharge: 2021-12-01 | Disposition: A | Discharge status: Home / Self Care | Payer: BC Managed Care – PPO | Source: Ambulatory Visit | Attending: Family Medicine | Admitting: Family Medicine

## 2021-12-01 ENCOUNTER — Other Ambulatory Visit: Payer: Self-pay | Admitting: Family Medicine

## 2021-12-01 DIAGNOSIS — B3731 Acute candidiasis of vulva and vagina: Secondary | ICD-10-CM | POA: Diagnosis not present

## 2021-12-01 DIAGNOSIS — R051 Acute cough: Secondary | ICD-10-CM | POA: Diagnosis not present

## 2021-12-01 DIAGNOSIS — R0602 Shortness of breath: Secondary | ICD-10-CM | POA: Diagnosis not present

## 2021-12-01 DIAGNOSIS — R5383 Other fatigue: Secondary | ICD-10-CM | POA: Diagnosis not present

## 2021-12-01 DIAGNOSIS — J329 Chronic sinusitis, unspecified: Secondary | ICD-10-CM | POA: Diagnosis not present

## 2021-12-01 DIAGNOSIS — R059 Cough, unspecified: Secondary | ICD-10-CM | POA: Diagnosis not present

## 2021-12-13 DIAGNOSIS — R0982 Postnasal drip: Secondary | ICD-10-CM | POA: Diagnosis not present

## 2021-12-13 DIAGNOSIS — R053 Chronic cough: Secondary | ICD-10-CM | POA: Diagnosis not present

## 2021-12-24 DIAGNOSIS — R0982 Postnasal drip: Secondary | ICD-10-CM | POA: Diagnosis not present

## 2021-12-24 DIAGNOSIS — J3489 Other specified disorders of nose and nasal sinuses: Secondary | ICD-10-CM | POA: Diagnosis not present

## 2022-04-29 DIAGNOSIS — Z23 Encounter for immunization: Secondary | ICD-10-CM | POA: Diagnosis not present

## 2022-05-10 DIAGNOSIS — R197 Diarrhea, unspecified: Secondary | ICD-10-CM | POA: Diagnosis not present

## 2022-06-07 DIAGNOSIS — R197 Diarrhea, unspecified: Secondary | ICD-10-CM | POA: Diagnosis not present

## 2022-07-05 DIAGNOSIS — J988 Other specified respiratory disorders: Secondary | ICD-10-CM | POA: Diagnosis not present

## 2022-10-07 DIAGNOSIS — F410 Panic disorder [episodic paroxysmal anxiety] without agoraphobia: Secondary | ICD-10-CM | POA: Diagnosis not present

## 2022-10-07 DIAGNOSIS — F4321 Adjustment disorder with depressed mood: Secondary | ICD-10-CM | POA: Diagnosis not present

## 2022-10-07 DIAGNOSIS — F419 Anxiety disorder, unspecified: Secondary | ICD-10-CM | POA: Diagnosis not present

## 2022-10-07 DIAGNOSIS — R030 Elevated blood-pressure reading, without diagnosis of hypertension: Secondary | ICD-10-CM | POA: Diagnosis not present

## 2023-01-25 DIAGNOSIS — R778 Other specified abnormalities of plasma proteins: Secondary | ICD-10-CM | POA: Diagnosis not present

## 2023-01-25 DIAGNOSIS — Z13220 Encounter for screening for lipoid disorders: Secondary | ICD-10-CM | POA: Diagnosis not present

## 2023-01-25 DIAGNOSIS — Z0184 Encounter for antibody response examination: Secondary | ICD-10-CM | POA: Diagnosis not present

## 2023-01-25 DIAGNOSIS — Z0000 Encounter for general adult medical examination without abnormal findings: Secondary | ICD-10-CM | POA: Diagnosis not present

## 2023-01-25 DIAGNOSIS — F418 Other specified anxiety disorders: Secondary | ICD-10-CM | POA: Diagnosis not present

## 2023-01-25 DIAGNOSIS — Z853 Personal history of malignant neoplasm of breast: Secondary | ICD-10-CM | POA: Diagnosis not present

## 2023-02-09 DIAGNOSIS — Z1231 Encounter for screening mammogram for malignant neoplasm of breast: Secondary | ICD-10-CM | POA: Diagnosis not present

## 2023-02-22 ENCOUNTER — Other Ambulatory Visit: Payer: Self-pay | Admitting: Family Medicine

## 2023-02-22 DIAGNOSIS — C50911 Malignant neoplasm of unspecified site of right female breast: Secondary | ICD-10-CM | POA: Diagnosis not present

## 2023-02-22 DIAGNOSIS — R928 Other abnormal and inconclusive findings on diagnostic imaging of breast: Secondary | ICD-10-CM

## 2023-02-22 DIAGNOSIS — R921 Mammographic calcification found on diagnostic imaging of breast: Secondary | ICD-10-CM | POA: Diagnosis not present

## 2023-02-22 DIAGNOSIS — R92333 Mammographic heterogeneous density, bilateral breasts: Secondary | ICD-10-CM | POA: Diagnosis not present

## 2023-03-01 ENCOUNTER — Ambulatory Visit
Admission: RE | Admit: 2023-03-01 | Discharge: 2023-03-01 | Disposition: A | Discharge status: Home / Self Care | Payer: BC Managed Care – PPO | Source: Ambulatory Visit | Attending: Family Medicine | Admitting: Family Medicine

## 2023-03-01 DIAGNOSIS — R928 Other abnormal and inconclusive findings on diagnostic imaging of breast: Secondary | ICD-10-CM

## 2023-03-01 DIAGNOSIS — R921 Mammographic calcification found on diagnostic imaging of breast: Secondary | ICD-10-CM | POA: Diagnosis not present

## 2023-03-01 HISTORY — PX: BREAST BIOPSY: SHX20

## 2023-03-18 DIAGNOSIS — R509 Fever, unspecified: Secondary | ICD-10-CM | POA: Diagnosis not present

## 2023-03-18 DIAGNOSIS — Z03818 Encounter for observation for suspected exposure to other biological agents ruled out: Secondary | ICD-10-CM | POA: Diagnosis not present

## 2023-03-18 DIAGNOSIS — J069 Acute upper respiratory infection, unspecified: Secondary | ICD-10-CM | POA: Diagnosis not present

## 2023-03-18 DIAGNOSIS — R051 Acute cough: Secondary | ICD-10-CM | POA: Diagnosis not present

## 2023-03-21 DIAGNOSIS — J069 Acute upper respiratory infection, unspecified: Secondary | ICD-10-CM | POA: Diagnosis not present

## 2023-04-15 DIAGNOSIS — H1089 Other conjunctivitis: Secondary | ICD-10-CM | POA: Diagnosis not present

## 2023-04-15 DIAGNOSIS — J018 Other acute sinusitis: Secondary | ICD-10-CM | POA: Diagnosis not present

## 2023-12-07 ENCOUNTER — Encounter (INDEPENDENT_AMBULATORY_CARE_PROVIDER_SITE_OTHER): Payer: Self-pay | Admitting: *Deleted

## 2024-02-01 ENCOUNTER — Encounter: Payer: Self-pay | Admitting: Internal Medicine

## 2024-02-22 ENCOUNTER — Other Ambulatory Visit: Payer: Self-pay | Admitting: *Deleted

## 2024-02-22 ENCOUNTER — Encounter: Payer: Self-pay | Admitting: *Deleted

## 2024-02-22 ENCOUNTER — Ambulatory Visit (INDEPENDENT_AMBULATORY_CARE_PROVIDER_SITE_OTHER): Admitting: Internal Medicine

## 2024-02-22 VITALS — BP 138/85 | HR 84 | Temp 98.6°F | Ht 68.0 in | Wt 191.2 lb

## 2024-02-22 DIAGNOSIS — K219 Gastro-esophageal reflux disease without esophagitis: Secondary | ICD-10-CM | POA: Diagnosis not present

## 2024-02-22 DIAGNOSIS — R1310 Dysphagia, unspecified: Secondary | ICD-10-CM | POA: Diagnosis not present

## 2024-02-22 DIAGNOSIS — Z860101 Personal history of adenomatous and serrated colon polyps: Secondary | ICD-10-CM

## 2024-02-22 DIAGNOSIS — Z860100 Personal history of colon polyps, unspecified: Secondary | ICD-10-CM

## 2024-02-22 MED ORDER — PEG 3350-KCL-NA BICARB-NACL 420 G PO SOLR: 4000.0000 mL | Freq: Once | ORAL | 0 refills | Status: AC

## 2024-02-22 NOTE — Progress Notes (Signed)
 Primary Care Physician:  Rolinda Millman, MD Primary Gastroenterologist:  Dr. Cindie  Chief Complaint  Patient presents with   New Patient (Initial Visit)    Pt referred for GERD. Pt has started back taking her Omeprazole  and it helps    HPI:   Amy Douglas is a 53 y.o. female who presents to the clinic today by referral from her PCP Dr. Rolinda for evaluation.  Chronic GERD, odynophagia: Patient states she has had reflux for many years.  Recently started back on omeprazole  20 mg daily approximately 1 month ago.  This has helped with her reflux symptoms though continues to have swallowing difficulties.  Describes painful swallowing with meals.  States she had an upper endoscopy many years ago which noted a narrowing of her esophagus.  I do not have access to this report however.  No melena or hematochezia.  No chronic NSAID use.  History of adenomatous colon polyps: Colonoscopy 01/24/2019 with tubular adenoma removed.  Patient denies any family history of colorectal malignancy.  No abdominal pain.  No unintentional weight loss.  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   Pneumonia     Past Surgical History:  Procedure Laterality Date   anterior cervical discectomy and fusion     BREAST BIOPSY Left 03/01/2023   MM LT BREAST BX W LOC DEV 1ST LESION IMAGE BX SPEC STEREO GUIDE 03/01/2023 [facility]   COLONOSCOPY N/A 01/24/2019   Procedure: COLONOSCOPY;  Surgeon: Harvey Margo CROME, MD;  [facility];  Service: Endoscopy;  Laterality: N/A;  2:00pm   MASTECTOMY     maxillofacial Bilateral    POLYPECTOMY  01/24/2019   Procedure: POLYPECTOMY;  Surgeon: Harvey Margo CROME, MD;  [facility];  Service: Endoscopy;;    Current Outpatient Medications  Medication Sig Dispense Refill   Aspirin-Acetaminophen -Caffeine (GOODYS EXTRA STRENGTH PO) Take 1 packet by mouth as needed (pain.).      cetirizine (ZYRTEC)  10 MG tablet Take 10 mg by mouth daily.     fluticasone (FLONASE) 50 MCG/ACT nasal spray Place into both nostrils every other day.     omeprazole  (PRILOSEC) 20 MG capsule Take 20 mg by mouth daily.     sertraline  (ZOLOFT ) 100 MG tablet Take 1 tablet (100 mg total) by mouth daily. 90 tablet 1   predniSONE  (DELTASONE ) 5 MG tablet Take 1 tablet (5 mg total) by mouth daily with breakfast. 20 tablet 0   tiZANidine (ZANAFLEX) 4 MG capsule Take 4 mg by mouth 3 (three) times daily as needed.     No current facility-administered medications for this visit.    Allergies as of 02/22/2024 - Review Complete 02/22/2024  Allergen Reaction Noted   Hydroxyzine pamoate Itching 03/30/2015   Amoxicillin-pot clavulanate Rash 03/30/2015   Latex Other (See Comments) 04/30/2015   Other Other (See Comments) 03/31/2015   Diclofenac  10/17/2017   Levaquin  [levofloxacin ] Itching 03/05/2018   Prednisone   10/17/2017   Vistaril [hydroxyzine hcl]  03/02/2018   Lamotrigine  Other (See Comments) 03/02/2018    Family History  Problem Relation Age of Onset   Hypertension Mother    Dementia Mother    Diabetes Father    Dementia Father    Heart disease Father    Diabetes Maternal Grandmother    Diabetes Paternal Grandmother    Heart disease Paternal Grandmother    Stroke Paternal Grandmother    Hiatal hernia Son  Cancer Maternal Aunt    Colon cancer Neg Hx    Colon polyps Neg Hx     Social History   Socioeconomic History   Marital status: Widowed    Spouse name: Not on file   Number of children: 1   Years of education: Not on file   Highest education level: Not on file  Occupational History   Occupation: IT sales professional: [id number]  Tobacco Use   Smoking status: Former    Current packs/day: 0.00    Average packs/day: 1 pack/day for 12.0 years (12.0 ttl pk-yrs)    Types: Cigarettes    Start date: 06/30/1989    Quit date: 06/30/2001    Years since quitting: 22.6   Smokeless tobacco:  Never  Vaping Use   Vaping status: Never Used  Substance and Sexual Activity   Alcohol use: Yes    Comment: occasional   Drug use: No   Sexual activity: Yes  Other Topics Concern   Not on file  Social History Narrative   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   Social Drivers of Corporate investment banker Strain: Not on file  Food Insecurity: Not on file  Transportation Needs: Not on file  Physical Activity: Not on file  Stress: Not on file  Social Connections: Not on file  Intimate Partner Violence: Not on file    Subjective: Review of Systems  Constitutional:  Negative for chills and fever.  HENT:  Negative for congestion and hearing loss.   Eyes:  Negative for blurred vision and double vision.  Respiratory:  Negative for cough and shortness of breath.   Cardiovascular:  Negative for chest pain and palpitations.  Gastrointestinal:  Positive for heartburn. Negative for abdominal pain, blood in stool, constipation, diarrhea, melena and vomiting.       Odynophagia  Genitourinary:  Negative for dysuria and urgency.  Musculoskeletal:  Negative for joint pain and myalgias.  Skin:  Negative for itching and rash.  Neurological:  Negative for dizziness and headaches.  Psychiatric/Behavioral:  Negative for depression. The patient is not nervous/anxious.        Objective: BP 138/85   Pulse 84   Temp 98.6 F (37 C)   Ht 5' 8 (1.727 m)   Wt 191 lb 3.2 oz (86.7 kg)   LMP 09/28/2023   BMI 29.07 kg/m  Physical Exam Constitutional:      Appearance: Normal appearance.  HENT:     Head: Normocephalic and atraumatic.  Eyes:     Extraocular Movements: Extraocular movements intact.     Conjunctiva/sclera: Conjunctivae normal.  Cardiovascular:     Rate and Rhythm: Normal rate and regular rhythm.  Pulmonary:     Effort: Pulmonary effort is normal.     Breath sounds: Normal breath sounds.  Abdominal:     General: Bowel sounds are normal.      Palpations: Abdomen is soft.  Musculoskeletal:        General: No swelling. Normal range of motion.     Cervical back: Normal range of motion and neck supple.  Skin:    General: Skin is warm and dry.     Coloration: Skin is not jaundiced.  Neurological:     General: No focal deficit present.     Mental Status: She is alert and oriented to person, place, and time.  Psychiatric:        Mood and Affect: Mood normal.  Behavior: Behavior normal.      Assessment/Plan:  1.  Chronic GERD, odynophagia- Will schedule for EGD with possible dilation  to evaluate for peptic ulcer disease, esophagitis, gastritis, H. Pylori, duodenitis, or other. Will also evaluate for esophageal stricture, Schatzki's ring, esophageal web or other.   Continue on omeprazole  20 mg daily.  May make adjustments pending endoscopic findings.  2.  Personal history of adenomatous colon polyps-at same time as upper endoscopy, we will perform colonoscopy for surveillance purposes.  The risks including infection, bleed, or perforation as well as benefits, limitations, alternatives and imponderables have been reviewed with the patient. Potential for esophageal dilation, biopsy, etc. have also been reviewed.  Questions have been answered. All parties agreeable.  Thank you Dr. Rolinda for the kind referral.   02/22/2024 9:54 AM   Disclaimer: This note was dictated with voice recognition software. Similar sounding words can inadvertently be transcribed and may not be corrected upon review.

## 2024-02-22 NOTE — Patient Instructions (Signed)
 We will schedule you for upper endoscopy to further evaluate your painful swallowing as well as acid reflux.  I may elect to stretch your esophagus depending on findings.  Continue omeprazole  daily.  At the same time as upper endoscopy, we will perform colonoscopy given your history of polyps.  It was very nice meeting you today.  Dr. Cindie

## 2024-02-22 NOTE — H&P (View-Only) (Signed)
 Primary Care Physician:  Rolinda Millman, MD Primary Gastroenterologist:  Dr. Cindie  Chief Complaint  Patient presents with   New Patient (Initial Visit)    Pt referred for GERD. Pt has started back taking her Omeprazole  and it helps    HPI:   Amy Douglas is a 53 y.o. female who presents to the clinic today by referral from her PCP Dr. Rolinda for evaluation.  Chronic GERD, odynophagia: Patient states she has had reflux for many years.  Recently started back on omeprazole  20 mg daily approximately 1 month ago.  This has helped with her reflux symptoms though continues to have swallowing difficulties.  Describes painful swallowing with meals.  States she had an upper endoscopy many years ago which noted a narrowing of her esophagus.  I do not have access to this report however.  No melena or hematochezia.  No chronic NSAID use.  History of adenomatous colon polyps: Colonoscopy 01/24/2019 with tubular adenoma removed.  Patient denies any family history of colorectal malignancy.  No abdominal pain.  No unintentional weight loss.  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   Pneumonia     Past Surgical History:  Procedure Laterality Date   anterior cervical discectomy and fusion     BREAST BIOPSY Left 03/01/2023   MM LT BREAST BX W LOC DEV 1ST LESION IMAGE BX SPEC STEREO GUIDE 03/01/2023 [facility]   COLONOSCOPY N/A 01/24/2019   Procedure: COLONOSCOPY;  Surgeon: Harvey Margo CROME, MD;  Location: [facility];  Service: Endoscopy;  Laterality: N/A;  2:00pm   MASTECTOMY     maxillofacial Bilateral    POLYPECTOMY  01/24/2019   Procedure: POLYPECTOMY;  Surgeon: Harvey Margo CROME, MD;  Location: [facility];  Service: Endoscopy;;    Current Outpatient Medications  Medication Sig Dispense Refill   Aspirin-Acetaminophen -Caffeine (GOODYS EXTRA STRENGTH PO) Take 1 packet by mouth as needed (pain.).      cetirizine (ZYRTEC)  10 MG tablet Take 10 mg by mouth daily.     fluticasone (FLONASE) 50 MCG/ACT nasal spray Place into both nostrils every other day.     omeprazole  (PRILOSEC) 20 MG capsule Take 20 mg by mouth daily.     sertraline  (ZOLOFT ) 100 MG tablet Take 1 tablet (100 mg total) by mouth daily. 90 tablet 1   predniSONE  (DELTASONE ) 5 MG tablet Take 1 tablet (5 mg total) by mouth daily with breakfast. 20 tablet 0   tiZANidine (ZANAFLEX) 4 MG capsule Take 4 mg by mouth 3 (three) times daily as needed.     No current facility-administered medications for this visit.    Allergies as of 02/22/2024 - Review Complete 02/22/2024  Allergen Reaction Noted   Hydroxyzine pamoate Itching 03/30/2015   Amoxicillin-pot clavulanate Rash 03/30/2015   Latex Other (See Comments) 04/30/2015   Other Other (See Comments) 03/31/2015   Diclofenac  10/17/2017   Levaquin  [levofloxacin ] Itching 03/05/2018   Prednisone   10/17/2017   Vistaril [hydroxyzine hcl]  03/02/2018   Lamotrigine  Other (See Comments) 03/02/2018    Family History  Problem Relation Age of Onset   Hypertension Mother    Dementia Mother    Diabetes Father    Dementia Father    Heart disease Father    Diabetes Maternal Grandmother    Diabetes Paternal Grandmother    Heart disease Paternal Grandmother    Stroke Paternal Grandmother    Hiatal hernia Son  Cancer Maternal Aunt    Colon cancer Neg Hx    Colon polyps Neg Hx     Social History   Socioeconomic History   Marital status: Widowed    Spouse name: Not on file   Number of children: 1   Years of education: Not on file   Highest education level: Not on file  Occupational History   Occupation: IT sales professional: [id number]  Tobacco Use   Smoking status: Former    Current packs/day: 0.00    Average packs/day: 1 pack/day for 12.0 years (12.0 ttl pk-yrs)    Types: Cigarettes    Start date: 06/30/1989    Quit date: 06/30/2001    Years since quitting: 22.6   Smokeless tobacco:  Never  Vaping Use   Vaping status: Never Used  Substance and Sexual Activity   Alcohol use: Yes    Comment: occasional   Drug use: No   Sexual activity: Yes  Other Topics Concern   Not on file  Social History Narrative   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   Social Drivers of Corporate investment banker Strain: Not on file  Food Insecurity: Not on file  Transportation Needs: Not on file  Physical Activity: Not on file  Stress: Not on file  Social Connections: Not on file  Intimate Partner Violence: Not on file    Subjective: Review of Systems  Constitutional:  Negative for chills and fever.  HENT:  Negative for congestion and hearing loss.   Eyes:  Negative for blurred vision and double vision.  Respiratory:  Negative for cough and shortness of breath.   Cardiovascular:  Negative for chest pain and palpitations.  Gastrointestinal:  Positive for heartburn. Negative for abdominal pain, blood in stool, constipation, diarrhea, melena and vomiting.       Odynophagia  Genitourinary:  Negative for dysuria and urgency.  Musculoskeletal:  Negative for joint pain and myalgias.  Skin:  Negative for itching and rash.  Neurological:  Negative for dizziness and headaches.  Psychiatric/Behavioral:  Negative for depression. The patient is not nervous/anxious.        Objective: BP 138/85   Pulse 84   Temp 98.6 F (37 C)   Ht 5' 8 (1.727 m)   Wt 191 lb 3.2 oz (86.7 kg)   LMP 09/28/2023   BMI 29.07 kg/m  Physical Exam Constitutional:      Appearance: Normal appearance.  HENT:     Head: Normocephalic and atraumatic.  Eyes:     Extraocular Movements: Extraocular movements intact.     Conjunctiva/sclera: Conjunctivae normal.  Cardiovascular:     Rate and Rhythm: Normal rate and regular rhythm.  Pulmonary:     Effort: Pulmonary effort is normal.     Breath sounds: Normal breath sounds.  Abdominal:     General: Bowel sounds are normal.      Palpations: Abdomen is soft.  Musculoskeletal:        General: No swelling. Normal range of motion.     Cervical back: Normal range of motion and neck supple.  Skin:    General: Skin is warm and dry.     Coloration: Skin is not jaundiced.  Neurological:     General: No focal deficit present.     Mental Status: She is alert and oriented to person, place, and time.  Psychiatric:        Mood and Affect: Mood normal.  Behavior: Behavior normal.      Assessment/Plan:  1.  Chronic GERD, odynophagia- Will schedule for EGD with possible dilation  to evaluate for peptic ulcer disease, esophagitis, gastritis, H. Pylori, duodenitis, or other. Will also evaluate for esophageal stricture, Schatzki's ring, esophageal web or other.   Continue on omeprazole  20 mg daily.  May make adjustments pending endoscopic findings.  2.  Personal history of adenomatous colon polyps-at same time as upper endoscopy, we will perform colonoscopy for surveillance purposes.  The risks including infection, bleed, or perforation as well as benefits, limitations, alternatives and imponderables have been reviewed with the patient. Potential for esophageal dilation, biopsy, etc. have also been reviewed.  Questions have been answered. All parties agreeable.  Thank you Dr. Rolinda for the kind referral.   02/22/2024 9:54 AM   Disclaimer: This note was dictated with voice recognition software. Similar sounding words can inadvertently be transcribed and may not be corrected upon review.

## 2024-03-05 ENCOUNTER — Encounter (HOSPITAL_COMMUNITY): Payer: Self-pay

## 2024-03-05 ENCOUNTER — Encounter (HOSPITAL_COMMUNITY)
Admission: RE | Admit: 2024-03-05 | Discharge: 2024-03-05 | Disposition: A | Discharge status: Home / Self Care | Source: Ambulatory Visit | Attending: Internal Medicine | Admitting: Internal Medicine

## 2024-03-05 DIAGNOSIS — Z01818 Encounter for other preprocedural examination: Secondary | ICD-10-CM

## 2024-03-05 HISTORY — DX: Gastro-esophageal reflux disease without esophagitis: K21.9

## 2024-03-05 HISTORY — DX: Depression, unspecified: F32.A

## 2024-03-05 HISTORY — DX: Anxiety disorder, unspecified: F41.9

## 2024-03-07 NOTE — Anesthesia Preprocedure Evaluation (Signed)
 Anesthesia Evaluation  Patient identified by MRN, date of birth, ID band Patient awake    Reviewed: Allergy & Precautions, H&P , NPO status , Patient's Chart, lab work & pertinent test results, reviewed documented beta blocker date and time   Airway Mallampati: II  TM Distance: >3 FB Neck ROM: full    Dental no notable dental hx. (+) Dental Advisory Given, Teeth Intact   Pulmonary pneumonia, resolved, former smoker   Pulmonary exam normal breath sounds clear to auscultation       Cardiovascular Exercise Tolerance: Good hypertension, Normal cardiovascular exam Rhythm:regular Rate:Normal     Neuro/Psych  Headaches PSYCHIATRIC DISORDERS Anxiety Depression       GI/Hepatic Neg liver ROS,GERD  ,,  Endo/Other  negative endocrine ROS    Renal/GU negative Renal ROS  negative genitourinary   Musculoskeletal   Abdominal   Peds  Hematology negative hematology ROS (+)   Anesthesia Other Findings Breast cancer  Reproductive/Obstetrics negative OB ROS                              Anesthesia Physical Anesthesia Plan  ASA: 2  Anesthesia Plan: General   Post-op Pain Management: Minimal or no pain anticipated   Induction: Intravenous  PONV Risk Score and Plan: Propofol  infusion  Airway Management Planned: Natural Airway and Nasal Cannula  Additional Equipment: None  Intra-op Plan:   Post-operative Plan:   Informed Consent: I have reviewed the patients History and Physical, chart, labs and discussed the procedure including the risks, benefits and alternatives for the proposed anesthesia with the patient or authorized representative who has indicated his/her understanding and acceptance.     Dental Advisory Given  Plan Discussed with: CRNA  Anesthesia Plan Comments:          Anesthesia Quick Evaluation

## 2024-03-08 ENCOUNTER — Ambulatory Visit (HOSPITAL_COMMUNITY): Payer: Self-pay | Admitting: Anesthesiology

## 2024-03-08 ENCOUNTER — Encounter (HOSPITAL_COMMUNITY)
Admission: RE | Disposition: A | Discharge status: Home / Self Care | Payer: Self-pay | Source: Home / Self Care | Attending: Internal Medicine

## 2024-03-08 ENCOUNTER — Other Ambulatory Visit: Payer: Self-pay

## 2024-03-08 ENCOUNTER — Encounter (HOSPITAL_COMMUNITY): Payer: Self-pay | Admitting: Internal Medicine

## 2024-03-08 ENCOUNTER — Ambulatory Visit (HOSPITAL_COMMUNITY)
Admission: RE | Admit: 2024-03-08 | Discharge: 2024-03-08 | Disposition: A | Discharge status: Home / Self Care | Attending: Internal Medicine | Admitting: Internal Medicine

## 2024-03-08 DIAGNOSIS — K644 Residual hemorrhoidal skin tags: Secondary | ICD-10-CM

## 2024-03-08 DIAGNOSIS — Z87891 Personal history of nicotine dependence: Secondary | ICD-10-CM | POA: Insufficient documentation

## 2024-03-08 DIAGNOSIS — K2950 Unspecified chronic gastritis without bleeding: Secondary | ICD-10-CM | POA: Insufficient documentation

## 2024-03-08 DIAGNOSIS — R1310 Dysphagia, unspecified: Secondary | ICD-10-CM | POA: Insufficient documentation

## 2024-03-08 DIAGNOSIS — K219 Gastro-esophageal reflux disease without esophagitis: Secondary | ICD-10-CM | POA: Insufficient documentation

## 2024-03-08 DIAGNOSIS — D128 Benign neoplasm of rectum: Secondary | ICD-10-CM

## 2024-03-08 DIAGNOSIS — K3189 Other diseases of stomach and duodenum: Secondary | ICD-10-CM | POA: Diagnosis not present

## 2024-03-08 DIAGNOSIS — F419 Anxiety disorder, unspecified: Secondary | ICD-10-CM | POA: Diagnosis not present

## 2024-03-08 DIAGNOSIS — Z1211 Encounter for screening for malignant neoplasm of colon: Secondary | ICD-10-CM | POA: Diagnosis not present

## 2024-03-08 DIAGNOSIS — K648 Other hemorrhoids: Secondary | ICD-10-CM | POA: Insufficient documentation

## 2024-03-08 DIAGNOSIS — Z860101 Personal history of adenomatous and serrated colon polyps: Secondary | ICD-10-CM

## 2024-03-08 DIAGNOSIS — K621 Rectal polyp: Secondary | ICD-10-CM | POA: Insufficient documentation

## 2024-03-08 DIAGNOSIS — F32A Depression, unspecified: Secondary | ICD-10-CM | POA: Insufficient documentation

## 2024-03-08 DIAGNOSIS — Z01818 Encounter for other preprocedural examination: Secondary | ICD-10-CM

## 2024-03-08 HISTORY — PX: ESOPHAGOGASTRODUODENOSCOPY: SHX5428

## 2024-03-08 HISTORY — PX: ESOPHAGEAL DILATION: SHX303

## 2024-03-08 HISTORY — PX: COLONOSCOPY: SHX5424

## 2024-03-08 LAB — POCT PREGNANCY, URINE: Preg Test, Ur: NEGATIVE

## 2024-03-08 SURGERY — COLONOSCOPY
Anesthesia: General

## 2024-03-08 MED ORDER — LACTATED RINGERS IV SOLN
INTRAVENOUS | Status: DC
Start: 2024-03-08 — End: 2024-03-08

## 2024-03-08 MED ORDER — LACTATED RINGERS IV SOLN: INTRAVENOUS | Status: DC | PRN

## 2024-03-08 MED ORDER — LIDOCAINE 2% (20 MG/ML) 5 ML SYRINGE
INTRAMUSCULAR | Status: DC | PRN
  Administered 2024-03-08: 60 mg via INTRAVENOUS

## 2024-03-08 MED ORDER — PANTOPRAZOLE SODIUM 40 MG PO TBEC: 40.0000 mg | DELAYED_RELEASE_TABLET | Freq: Every day | ORAL | 11 refills | Status: AC

## 2024-03-08 MED ORDER — PROPOFOL 500 MG/50ML IV EMUL
INTRAVENOUS | Status: DC | PRN
  Administered 2024-03-08: 150 ug/kg/min via INTRAVENOUS
  Administered 2024-03-08: 100 mg via INTRAVENOUS

## 2024-03-08 NOTE — Op Note (Signed)
 [facility] Patient Name: Amy Douglas Procedure Date: 03/08/2024 8:36 AM MRN: [id number] Date of Birth: 12-01-1970 Attending MD: Carlin POUR. Cindie , [place], [id number] CSN: [id number] Age: 53 Admit Type: Outpatient Procedure:                Upper GI endoscopy Indications:              Odynophagia, Heartburn Providers:                Carlin POUR. Cindie, DO, Jessica Boudreaux, Emilee                            Tubb RN, RN Referring MD:              Medicines:                See the Anesthesia note for documentation of the                            administered medications Complications:            No immediate complications. Estimated Blood Loss:     Estimated blood loss was minimal. Procedure:                Pre-Anesthesia Assessment:                           - The anesthesia plan was to use monitored                            anesthesia care (MAC).                           After obtaining informed consent, the endoscope was                            passed under direct vision. Throughout the                            procedure, the patient's blood pressure, pulse, and                            oxygen saturations were monitored continuously. The                            [id number] ([id number])Leezm was introduced through the                            mouth, and advanced to the second part of duodenum.                            The upper GI endoscopy was accomplished without                            difficulty. The patient tolerated the procedure                            well. Scope In: 8:46:41 AM Scope  Out: 8:50:53 AM Total Procedure Duration: 0 hours 4 minutes 12 seconds  Findings:      The Z-line was regular and was found 40 cm from the incisors.      There is no endoscopic evidence of stenosis or stricture in the entire       esophagus.      Biopsies were taken with a cold forceps in the middle third of the       esophagus for histology.      Patchy mild  inflammation characterized by erythema was found in the       gastric body and in the gastric antrum. Biopsies were taken with a cold       forceps for Helicobacter pylori testing.      The duodenal bulb, first portion of the duodenum and second portion of       the duodenum were normal. Impression:               - Z-line regular, 40 cm from the incisors.                           - Gastritis. Biopsied.                           - Normal duodenal bulb, first portion of the                            duodenum and second portion of the duodenum.                           - Biopsies were taken with a cold forceps for                            histology in the middle third of the esophagus. Moderate Sedation:      Per Anesthesia Care Recommendation:           - Patient has a contact number available for                            emergencies. The signs and symptoms of potential                            delayed complications were discussed with the                            patient. Return to normal activities tomorrow.                            Written discharge instructions were provided to the                            patient.                           - Resume previous diet.                           - Continue present medications.                           -  Await pathology results.                           - Use Protonix  (pantoprazole ) 40 mg PO daily.                           - Return to GI clinic in 3 months. Procedure Code(s):        --- Professional ---                           [id number], Esophagogastroduodenoscopy, flexible,                            transoral; with biopsy, single or multiple Diagnosis Code(s):        --- Professional ---                           K29.70, Gastritis, unspecified, without bleeding                           R13.10, Dysphagia, unspecified                           R12, Heartburn CPT copyright 2022 American Medical Association. All rights  reserved. The codes documented in this report are preliminary and upon coder review may  be revised to meet current compliance requirements. Carlin POUR. Cindie, DO Carlin POUR. Cindie, DO 03/08/2024 8:53:38 AM This report has been signed electronically. Number of Addenda: 0

## 2024-03-08 NOTE — Interval H&P Note (Signed)
 History and Physical Interval Note:  03/08/2024 8:33 AM  Amy Douglas  has presented today for surgery, with the diagnosis of HX POLYPS, GERD, ODYNOPHAGIA.  The various methods of treatment have been discussed with the patient and family. After consideration of risks, benefits and other options for treatment, the patient has consented to  Procedure(s) with comments: COLONOSCOPY (N/A) - 830am, asa 2 EGD (ESOPHAGOGASTRODUODENOSCOPY) (N/A) DILATION, ESOPHAGUS (N/A) as a surgical intervention.  The patient's history has been reviewed, patient examined, no change in status, stable for surgery.  I have reviewed the patient's chart and labs.  Questions were answered to the patient's satisfaction.     Carlin MARLA Hasty

## 2024-03-08 NOTE — Op Note (Signed)
 [facility] Patient Name: Amy Douglas Procedure Date: 03/08/2024 8:34 AM MRN: [id number] Date of Birth: 12/21/1970 Attending MD: Carlin POUR. Cindie , [place], [id number] CSN: [id number] Age: 53 Admit Type: Outpatient Procedure:                Colonoscopy Indications:              Surveillance: Personal history of adenomatous                            polyps on last colonoscopy 5 years ago Providers:                Carlin POUR. Cindie, DO, Jessica Boudreaux, Emilee                            Tubb RN, RN Referring MD:              Medicines:                See the Anesthesia note for documentation of the                            administered medications Complications:            No immediate complications. Estimated Blood Loss:     Estimated blood loss was minimal. Procedure:                Pre-Anesthesia Assessment:                           - The anesthesia plan was to use monitored                            anesthesia care (MAC).                           After obtaining informed consent, the colonoscope                            was passed under direct vision. Throughout the                            procedure, the patient's blood pressure, pulse, and                            oxygen saturations were monitored continuously. The                            [id number] ([id number]) Peds Colon was introduced                            through the anus and advanced to the the cecum,                            identified by appendiceal orifice and ileocecal                            valve. The colonoscopy was performed  without                            difficulty. The patient tolerated the procedure                            well. The quality of the bowel preparation was                            evaluated using the BBPS [facility] Bowel Preparation                            Scale) with scores of: Right Colon = 3, Transverse                            Colon = 3 and Left Colon = 3  (entire mucosa seen                            well with no residual staining, small fragments of                            stool or opaque liquid). The total BBPS score                            equals 9. Scope In: Scope Out: 9:11:43 AM Scope Withdrawal Time: 0 hours 9 minutes 22 seconds  Findings:      Hemorrhoids were found on perianal exam.      Non-bleeding internal hemorrhoids were found.      A 5 mm polyp was found in the rectum. The polyp was sessile. The polyp       was removed with a cold snare. Resection and retrieval were complete.      The exam was otherwise without abnormality. Impression:               - Hemorrhoids found on perianal exam.                           - Non-bleeding internal hemorrhoids.                           - One 5 mm polyp in the rectum, removed with a cold                            snare. Resected and retrieved.                           - The examination was otherwise normal. Moderate Sedation:      Per Anesthesia Care Recommendation:           - Patient has a contact number available for                            emergencies. The signs and symptoms of potential  delayed complications were discussed with the                            patient. Return to normal activities tomorrow.                            Written discharge instructions were provided to the                            patient.                           - Resume previous diet.                           - Continue present medications.                           - Await pathology results.                           - Repeat colonoscopy in 7-10 years depending on                            pathology results for surveillance.                           - Return to GI clinic in 3 months. Procedure Code(s):        --- Professional ---                           [phone], Colonoscopy, flexible; with removal of                            tumor(s), polyp(s), or  other lesion(s) by snare                            technique Diagnosis Code(s):        --- Professional ---                           Z86.010, Personal history of colonic polyps                           D12.8, Benign neoplasm of rectum                           K64.8, Other hemorrhoids CPT copyright 2022 American Medical Association. All rights reserved. The codes documented in this report are preliminary and upon coder review may  be revised to meet current compliance requirements. Carlin POUR. Cindie, DO Carlin POUR. Cindie, DO 03/08/2024 9:14:48 AM This report has been signed electronically. Number of Addenda: 0

## 2024-03-08 NOTE — Anesthesia Postprocedure Evaluation (Signed)
 Anesthesia Post Note  Patient: Amy Douglas  Procedure(s) Performed: COLONOSCOPY EGD (ESOPHAGOGASTRODUODENOSCOPY) DILATION, ESOPHAGUS  Patient location during evaluation: Phase II Anesthesia Type: General Level of consciousness: awake and alert Pain management: pain level controlled Vital Signs Assessment: post-procedure vital signs reviewed and stable Respiratory status: spontaneous breathing, nonlabored ventilation and respiratory function stable Cardiovascular status: stable Anesthetic complications: no   There were no known notable events for this encounter.   Last Vitals:  Vitals:   03/08/24 0746 03/08/24 0915  BP:  122/61  Pulse: 72 71  Resp: 12   Temp:  36.8 C  SpO2: 99% 99%    Last Pain:  Vitals:   03/08/24 0915  TempSrc: Oral  PainSc:                  Carlin LITTIE Kawasaki

## 2024-03-08 NOTE — Discharge Instructions (Addendum)
 EGD Discharge instructions Please read the instructions outlined below and refer to this sheet in the next few weeks. These discharge instructions provide you with general information on caring for yourself after you leave the hospital. Your doctor may also give you specific instructions. While your treatment has been planned according to the most current medical practices available, unavoidable complications occasionally occur. If you have any problems or questions after discharge, please call your doctor. ACTIVITY You may resume your regular activity but move at a slower pace for the next 24 hours.  Take frequent rest periods for the next 24 hours.  Walking will help expel (get rid of) the air and reduce the bloated feeling in your abdomen.  No driving for 24 hours (because of the anesthesia (medicine) used during the test).  You may shower.  Do not sign any important legal documents or operate any machinery for 24 hours (because of the anesthesia used during the test).  NUTRITION Drink plenty of fluids.  You may resume your normal diet.  Begin with a light meal and progress to your normal diet.  Avoid alcoholic beverages for 24 hours or as instructed by your caregiver.  MEDICATIONS You may resume your normal medications unless your caregiver tells you otherwise.  WHAT YOU CAN EXPECT TODAY You may experience abdominal discomfort such as a feeling of fullness or "gas" pains.  FOLLOW-UP Your doctor will discuss the results of your test with you.  SEEK IMMEDIATE MEDICAL ATTENTION IF ANY OF THE FOLLOWING OCCUR: Excessive nausea (feeling sick to your stomach) and/or vomiting.  Severe abdominal pain and distention (swelling).  Trouble swallowing.  Temperature over 101 F (37.8 C).  Rectal bleeding or vomiting of blood.     Colonoscopy Discharge Instructions  Read the instructions outlined below and refer to this sheet in the next few weeks. These discharge instructions provide you with  general information on caring for yourself after you leave the hospital. Your doctor may also give you specific instructions. While your treatment has been planned according to the most current medical practices available, unavoidable complications occasionally occur.   ACTIVITY You may resume your regular activity, but move at a slower pace for the next 24 hours.  Take frequent rest periods for the next 24 hours.  Walking will help get rid of the air and reduce the bloated feeling in your belly (abdomen).  No driving for 24 hours (because of the medicine (anesthesia) used during the test).   Do not sign any important legal documents or operate any machinery for 24 hours (because of the anesthesia used during the test).  NUTRITION Drink plenty of fluids.  You may resume your normal diet as instructed by your doctor.  Begin with a light meal and progress to your normal diet. Heavy or fried foods are harder to digest and may make you feel sick to your stomach (nauseated).  Avoid alcoholic beverages for 24 hours or as instructed.  MEDICATIONS You may resume your normal medications unless your doctor tells you otherwise.  WHAT YOU CAN EXPECT TODAY Some feelings of bloating in the abdomen.  Passage of more gas than usual.  Spotting of blood in your stool or on the toilet paper.  IF YOU HAD POLYPS REMOVED DURING THE COLONOSCOPY: No aspirin products for 7 days or as instructed.  No alcohol for 7 days or as instructed.  Eat a soft diet for the next 24 hours.  FINDING OUT THE RESULTS OF YOUR TEST Not all test results are  available during your visit. If your test results are not back during the visit, make an appointment with your caregiver to find out the results. Do not assume everything is normal if you have not heard from your caregiver or the medical facility. It is important for you to follow up on all of your test results.  SEEK IMMEDIATE MEDICAL ATTENTION IF: You have more than a spotting of  blood in your stool.  Your belly is swollen (abdominal distention).  You are nauseated or vomiting.  You have a temperature over 101.  You have abdominal pain or discomfort that is severe or gets worse throughout the day.   Your EGD revealed mild amount inflammation in your stomach.  I took biopsies of this to rule out infection with a bacteria called H. pylori.  Your esophagus was wide open, I did not need to stretch it today.  I did take samples of your esophagus however.  Await pathology results, my office will contact you.  I am going to change your omeprazole  to pantoprazole  40 mg daily and have sent this to your pharmacy.  This medication works best if you take it at least 30 minutes before breakfast.  Your colonoscopy revealed 1 polyp(s) which I removed successfully. Await pathology results, my office will contact you. I recommend repeating colonoscopy in 7-10 years for surveillance purposes.   You have both internal and external hemorrhoids.  We can discuss treatment options for further disease in the office.  Follow-up in 2 to 3 months.  I hope you have a great rest of your week!  Carlin POUR. Cindie, D.O. Gastroenterology and Hepatology [facility]

## 2024-03-08 NOTE — Transfer of Care (Signed)
 Immediate Anesthesia Transfer of Care Note  Patient: Amy Douglas  Procedure(s) Performed: COLONOSCOPY EGD (ESOPHAGOGASTRODUODENOSCOPY) DILATION, ESOPHAGUS  Patient Location: Short Stay  Anesthesia Type:General  Level of Consciousness: awake  Airway & Oxygen Therapy: Patient Spontanous Breathing  Post-op Assessment: Report given to RN and Post -op Vital signs reviewed and stable  Post vital signs: Reviewed and stable  Last Vitals:  Vitals Value Taken Time  BP 122/61 03/08/24 09:15  Temp 36.8 C 03/08/24 09:15  Pulse 71 03/08/24 09:15  Resp 16   SpO2 99 % 03/08/24 09:15    Last Pain:  Vitals:   03/08/24 0915  TempSrc: Oral  PainSc:          Complications: No notable events documented.

## 2024-03-11 LAB — SURGICAL PATHOLOGY

## 2024-03-12 ENCOUNTER — Encounter (HOSPITAL_COMMUNITY): Payer: Self-pay | Admitting: Internal Medicine

## 2024-03-25 ENCOUNTER — Ambulatory Visit: Payer: Self-pay | Admitting: Internal Medicine

## 2024-05-29 ENCOUNTER — Encounter (INDEPENDENT_AMBULATORY_CARE_PROVIDER_SITE_OTHER): Payer: Self-pay

## 2024-06-06 ENCOUNTER — Ambulatory Visit: Admitting: Gastroenterology

## 2024-07-29 ENCOUNTER — Institutional Professional Consult (permissible substitution) (INDEPENDENT_AMBULATORY_CARE_PROVIDER_SITE_OTHER): Admitting: Otolaryngology

## 2024-08-22 ENCOUNTER — Institutional Professional Consult (permissible substitution) (INDEPENDENT_AMBULATORY_CARE_PROVIDER_SITE_OTHER): Admitting: Otolaryngology
